# Patient Record
Sex: Female | Born: 1985 | Race: Asian | Hispanic: No | Marital: Married | Smoking: Never smoker
Health system: Southern US, Community
[De-identification: ages and names within clinical notes are randomized; demographics above are authoritative.]

## PROBLEM LIST (undated history)

## (undated) DIAGNOSIS — R768 Other specified abnormal immunological findings in serum: Secondary | ICD-10-CM

## (undated) DIAGNOSIS — Z9889 Other specified postprocedural states: Secondary | ICD-10-CM

## (undated) DIAGNOSIS — R112 Nausea with vomiting, unspecified: Secondary | ICD-10-CM

## (undated) HISTORY — DX: Other specified postprocedural states: R11.2

## (undated) HISTORY — DX: Other specified postprocedural states: Z98.890

---

## 2017-09-24 ENCOUNTER — Encounter: Payer: Self-pay | Admitting: Obstetrics and Gynecology

## 2017-09-29 ENCOUNTER — Other Ambulatory Visit: Payer: Self-pay

## 2017-09-29 ENCOUNTER — Encounter: Payer: Self-pay | Admitting: Nurse Practitioner

## 2017-09-29 ENCOUNTER — Ambulatory Visit (INDEPENDENT_AMBULATORY_CARE_PROVIDER_SITE_OTHER): Payer: Medicaid Other | Admitting: Nurse Practitioner

## 2017-09-29 ENCOUNTER — Other Ambulatory Visit (HOSPITAL_COMMUNITY)
Admission: RE | Admit: 2017-09-29 | Discharge: 2017-09-29 | Disposition: A | Discharge status: Home / Self Care | Payer: Medicaid Other | Source: Ambulatory Visit | Attending: Obstetrics and Gynecology | Admitting: Obstetrics and Gynecology

## 2017-09-29 VITALS — BP 101/67 | HR 77 | Ht 63.0 in | Wt 134.0 lb

## 2017-09-29 DIAGNOSIS — O34219 Maternal care for unspecified type scar from previous cesarean delivery: Secondary | ICD-10-CM | POA: Insufficient documentation

## 2017-09-29 DIAGNOSIS — Z23 Encounter for immunization: Secondary | ICD-10-CM | POA: Diagnosis not present

## 2017-09-29 DIAGNOSIS — Z3482 Encounter for supervision of other normal pregnancy, second trimester: Secondary | ICD-10-CM | POA: Insufficient documentation

## 2017-09-29 DIAGNOSIS — Z789 Other specified health status: Secondary | ICD-10-CM

## 2017-09-29 DIAGNOSIS — Z3480 Encounter for supervision of other normal pregnancy, unspecified trimester: Secondary | ICD-10-CM

## 2017-09-29 LAB — POCT URINALYSIS DIP (DEVICE)
BILIRUBIN URINE: NEGATIVE
Glucose, UA: NEGATIVE mg/dL
HGB URINE DIPSTICK: NEGATIVE
KETONES UR: NEGATIVE mg/dL
LEUKOCYTES UA: NEGATIVE
Nitrite: NEGATIVE
Protein, ur: NEGATIVE mg/dL
UROBILINOGEN UA: 0.2 mg/dL (ref 0.0–1.0)
pH: 6.5 (ref 5.0–8.0)

## 2017-09-29 MED ORDER — DOXYLAMINE-PYRIDOXINE 10-10 MG PO TBEC: DELAYED_RELEASE_TABLET | ORAL | 2 refills | Status: DC

## 2017-09-29 MED ORDER — PRENATAL 19 29-1 MG PO TABS: 1.0000 | ORAL_TABLET | Freq: Every day | ORAL | 11 refills | Status: AC

## 2017-09-29 NOTE — Progress Notes (Signed)
Subjective:   Morgan PriestLydia Hafer is a 31 y.o. G3P2002 at 4124w1d by LMP being seen today for her first obstetrical visit.  Her obstetrical history is significant for patient reported diagnosis of hepatitis, language barrier - speaks Bermese. An official in person burmese interpeter accompanies her today and is present for all of the interview and exam.  Patient does intend to breast feed. Pregnancy history fully reviewed.  She has [place] one C/[place] of the [place] the [place].  [facility] Everywhere not available on this chart - [facility] ROI [facility]  Patient reports vomiting once a day and nausea more frequently.  She requests medication.  She also has headaches on one side of her head which respond to tylenol which she is taking.  She reports that she has Hepatitis B but has not seen a doctor for ongoing follow up for the hepatitis.  Will watch lab results and evaluate further as needed.  HISTORY: Obstetric History   G3   P2   T2   P0   A0   L2    SAB0   TAB0   Ectopic0   Multiple0   Live Births2     # Outcome Date GA Lbr Len/2nd Weight Sex Delivery Anes PTL Lv  3 Current           2 Term 05/30/13    Wandalee FerdinandM CS-LTranv   LIV  1 Term 11/06/07    Wandalee FerdinandM CS-LTranv   LIV     Past Medical History:  Diagnosis Date  . Medical history non-contributory   . PONV (postoperative nausea and vomiting)    Past Surgical History:  Procedure Laterality Date  . CESAREAN SECTION     Family History  Problem Relation Age of Onset  . Hypertension Mother   . Kidney disease Mother    Social History   Tobacco Use  . Smoking status: Never Smoker  . Smokeless tobacco: Never Used  Substance Use Topics  . Alcohol use: No    Frequency: Never  . Drug use: No   No Known Allergies Current Outpatient Medications on File Prior to Visit  Medication Sig Dispense Refill  . acetaminophen (TYLENOL) 500 MG tablet Take 1,000 mg by mouth every 6 (six) hours as  needed.     No current facility-administered medications on file prior to visit.      Exam   Vitals:   09/29/17 1025 09/29/17 1033  BP: 101/67   Pulse: 77   Weight: 134 lb (60.8 kg)   Height:  5\' 3"  (1.6 m)   Fetal Heart Rate (bpm): 153  Uterus:  Fundal Height: 14 cm  Pelvic Exam: Perineum: no hemorrhoids, normal perineum   Vulva: normal external genitalia, no lesions   Vagina:  normal mucosa, normal discharge   Cervix: no lesions and normal, pap smear done.    Adnexa: normal adnexa and no mass, fullness, tenderness   Bony Pelvis: average  System: General: well-developed, well-nourished female in no acute distress   Breast:  normal appearance, no masses or tenderness   Skin: normal coloration and turgor, no rashes   Neurologic: oriented, normal, negative, normal mood   Extremities: normal strength, tone, and muscle mass, ROM of all joints is normal   HEENT extraocular movement intact and sclera clear, anicteric   Mouth/Teeth mucous membranes moist, pharynx normal without lesions and dental hygiene good   Neck  supple and no masses   Cardiovascular: regular rate and rhythm   Respiratory:  no respiratory distress, normal breath sounds   Abdomen: soft, non-tender; bowel sounds normal; no masses,  no organomegaly. Low transverse C/S scar noted     Assessment:   Pregnancy: [id number] 6530w1d  Patient Active Problem List   Diagnosis Date Noted  . Previous cesarean section complicating pregnancy 09/29/2017  . Encounter for supervision of normal pregnancy in multigravida, antepartum 09/29/2017  . Language barrier affecting health care 09/29/2017     Plan:  1. Encounter for supervision of other normal pregnancy in second trimester  - Cytology - PAP - Hemoglobinopathy Evaluation - Cystic Fibrosis Mutation 97 - Culture, OB Urine - US MFM OB COMP + 14 WK; Future  2. Need for immunization against influenza Flu vaccine given today. - Flu Vaccine QUAD 6+ mos IM (Fluarix)  3.  Previous cesarean section complicating pregnancy Has had 2 previous C/S - did not discuss delivery plan today Client is unsure about post delivery contraception - advised to consider and make a plan in this pregnancy.  4.  Nausea in pregnancy - prescribed Diclegis and had the RN fill out the pre-authorization paperwork.  5.  Headaches -  taking tylenol and reviewed eating protein at every meal to assist in decreasing the number of headaches she has.  Initial labs drawn. Continue prenatal vitamins  eprescribed today. Drink at least 8 8-oz glasses of water every day. Advised no smoking, drugs or alcohol. Advised eating every 3 hours and including protein every time she eats. Diclegis eprescribed. Genetic Screening discussed, Too late for first screen today:  Ultrasound discussed; fetal anatomic survey: ordered. Problem list reviewed and updated. The nature of [facility] with multiple MDs and other Advanced Practice Providers was explained to patient; also emphasized that residents, students are part of our team. Routine obstetric precautions reviewed. Return in about 4 weeks (around 10/27/2017).  Total face-to-face time with patient: 30 minutes.  Over 50% of encounter was spent on counseling and coordination of care.     Nolene BernheimERRI BURLESON, FNP Family Nurse Practitioner, [facility], [facility] 09/29/2017 3:16 PM

## 2017-09-29 NOTE — Progress Notes (Signed)
Burmese interpreter present for visit

## 2017-10-01 LAB — CYTOLOGY - PAP
Chlamydia: NEGATIVE
Diagnosis: NEGATIVE
HPV: NOT DETECTED
NEISSERIA GONORRHEA: NEGATIVE

## 2017-10-01 LAB — URINE CULTURE, OB REFLEX: Organism ID, Bacteria: NO GROWTH

## 2017-10-01 LAB — CULTURE, OB URINE

## 2017-10-05 LAB — OBSTETRIC PANEL, INCLUDING HIV
Antibody Screen: NEGATIVE
Basophils Absolute: 0 10*3/uL (ref 0.0–0.2)
Basos: 0 %
EOS (ABSOLUTE): 0 10*3/uL (ref 0.0–0.4)
Eos: 0 %
HEMOGLOBIN: 13.5 g/dL (ref 11.1–15.9)
HIV Screen 4th Generation wRfx: NONREACTIVE
Hematocrit: 39.3 % (ref 34.0–46.6)
IMMATURE GRANS (ABS): 0 10*3/uL (ref 0.0–0.1)
IMMATURE GRANULOCYTES: 0 %
LYMPHS ABS: 1.2 10*3/uL (ref 0.7–3.1)
LYMPHS: 13 %
MCH: 30.8 pg (ref 26.6–33.0)
MCHC: 34.4 g/dL (ref 31.5–35.7)
MCV: 90 fL (ref 79–97)
Monocytes Absolute: 0.5 10*3/uL (ref 0.1–0.9)
Monocytes: 5 %
NEUTROS PCT: 82 %
Neutrophils Absolute: 7.8 10*3/uL — ABNORMAL HIGH (ref 1.4–7.0)
Platelets: 208 10*3/uL (ref 150–379)
RBC: 4.38 x10E6/uL (ref 3.77–5.28)
RDW: 13.1 % (ref 12.3–15.4)
RPR: NONREACTIVE
Rh Factor: POSITIVE
Rubella Antibodies, IGG: 3.02 index (ref >0.99)
WBC: 9.6 10*3/uL (ref 3.4–10.8)

## 2017-10-05 LAB — HEMOGLOBINOPATHY EVALUATION
FERRITIN: 88 ng/mL (ref 15–150)
HGB SOLUBILITY: NEGATIVE
Hgb A2 Quant: 2.5 % (ref 1.8–3.2)
Hgb A: 96.9 % (ref 96.4–98.8)
Hgb C: 0 %
Hgb F Quant: 0.6 % (ref 0.0–2.0)
Hgb S: 0 %
Hgb Variant: 0 %

## 2017-10-05 LAB — HEPATITIS B SURFACE AG, CONFIRM: HBsAg Confirmation: POSITIVE — AB

## 2017-10-05 LAB — CYSTIC FIBROSIS MUTATION 97: GENE DIS ANAL CARRIER INTERP BLD/T-IMP: NOT DETECTED

## 2017-10-09 ENCOUNTER — Encounter: Payer: Self-pay | Admitting: Nurse Practitioner

## 2017-10-09 DIAGNOSIS — R768 Other specified abnormal immunological findings in serum: Secondary | ICD-10-CM | POA: Insufficient documentation

## 2017-10-26 ENCOUNTER — Ambulatory Visit (INDEPENDENT_AMBULATORY_CARE_PROVIDER_SITE_OTHER): Payer: Medicaid Other | Admitting: Advanced Practice Midwife

## 2017-10-26 ENCOUNTER — Encounter (HOSPITAL_COMMUNITY): Payer: Self-pay

## 2017-10-26 ENCOUNTER — Encounter: Payer: Self-pay | Admitting: Advanced Practice Midwife

## 2017-10-26 VITALS — BP 108/54 | HR 68 | Wt 135.0 lb

## 2017-10-26 DIAGNOSIS — Z3482 Encounter for supervision of other normal pregnancy, second trimester: Secondary | ICD-10-CM

## 2017-10-26 DIAGNOSIS — Z3480 Encounter for supervision of other normal pregnancy, unspecified trimester: Secondary | ICD-10-CM

## 2017-10-26 DIAGNOSIS — R768 Other specified abnormal immunological findings in serum: Secondary | ICD-10-CM

## 2017-10-26 DIAGNOSIS — O34219 Maternal care for unspecified type scar from previous cesarean delivery: Secondary | ICD-10-CM

## 2017-10-26 DIAGNOSIS — Z789 Other specified health status: Secondary | ICD-10-CM

## 2017-10-26 NOTE — Patient Instructions (Signed)

## 2017-10-26 NOTE — Progress Notes (Signed)
States feels baby movement somedays not everyday. States when walking feels like baby is going down.

## 2017-10-26 NOTE — Progress Notes (Signed)
   PRENATAL VISIT NOTE  Subjective:  Morgan Richardson is a 31 y.o. G3P2002 at 8964w0d being seen today for ongoing prenatal care.  She is currently monitored for the following issues for this low-risk pregnancy and has Previous cesarean section complicating pregnancy; Encounter for supervision of normal pregnancy in multigravida, antepartum; Language barrier affecting health care; and Hepatitis B surface antigen positive on their problem list.  Patient reports no complaints.  Contractions: Not present. Vag. Bleeding: None.  Movement: Present. Denies leaking of fluid.   The following portions of the patient's history were reviewed and updated as appropriate: allergies, current medications, past family history, past medical history, past social history, past surgical history and problem list. Problem list updated.  Objective:   Vitals:   10/26/17 1124  BP: (!) 108/54  Pulse: 68  Weight: 135 lb (61.2 kg)    Fetal Status: Fetal Heart Rate (bpm): 166 Fundal Height: 19 cm Movement: Present     General:  Alert, oriented and cooperative. Patient is in no acute distress.  Skin: Skin is warm and dry. No rash noted.   Cardiovascular: Normal heart rate noted  Respiratory: Normal respiratory effort, no problems with respiration noted  Abdomen: Soft, gravid, appropriate for gestational age.  Pain/Pressure: Present     Pelvic: Cervical exam deferred        Extremities: Normal range of motion.  Edema: None  Mental Status:  Normal mood and affect. Normal behavior. Normal judgment and thought content.   Assessment and Plan:  Pregnancy: G3P2002 at 8064w0d  1. Encounter for supervision of normal pregnancy in multigravida, antepartum   2. Language barrier affecting health care - Interpreter used  3. Hepatitis B surface antigen positive  - Hepatitis B Core Antibody, total - Hepatitis B Core Antibody, IgM - Hepatitis B Surface AntiBODY  4. Previous cesarean section complicating pregnancy - ROI for  records  - [facility]  Preterm labor symptoms and general obstetric precautions including but not limited to vaginal bleeding, contractions, leaking of fluid and fetal movement were reviewed in detail with the patient. Please refer to After Visit Summary for other counseling recommendations.  Return in about 4 weeks (around 11/23/2017) for ROB.   Dorathy KinsmanVirginia Render Marley, CNM

## 2017-10-27 LAB — HEPATITIS B SURFACE ANTIBODY,QUALITATIVE: HEP B SURFACE AB, QUAL: REACTIVE

## 2017-10-27 LAB — HEPATITIS B CORE ANTIBODY, TOTAL: Hep B Core Total Ab: POSITIVE — AB

## 2017-10-27 LAB — HEPATITIS B CORE ANTIBODY, IGM: HEP B C IGM: NEGATIVE

## 2017-10-28 ENCOUNTER — Other Ambulatory Visit: Payer: Self-pay | Admitting: Nurse Practitioner

## 2017-10-28 ENCOUNTER — Ambulatory Visit (HOSPITAL_COMMUNITY)
Admission: RE | Admit: 2017-10-28 | Discharge: 2017-10-28 | Disposition: A | Discharge status: Home / Self Care | Payer: Medicaid Other | Source: Ambulatory Visit | Attending: Nurse Practitioner | Admitting: Nurse Practitioner

## 2017-10-28 DIAGNOSIS — O98419 Viral hepatitis complicating pregnancy, unspecified trimester: Secondary | ICD-10-CM

## 2017-10-28 DIAGNOSIS — O98412 Viral hepatitis complicating pregnancy, second trimester: Secondary | ICD-10-CM | POA: Diagnosis not present

## 2017-10-28 DIAGNOSIS — Z3A19 19 weeks gestation of pregnancy: Secondary | ICD-10-CM

## 2017-10-28 DIAGNOSIS — B1910 Unspecified viral hepatitis B without hepatic coma: Secondary | ICD-10-CM | POA: Diagnosis not present

## 2017-10-28 DIAGNOSIS — Z3689 Encounter for other specified antenatal screening: Secondary | ICD-10-CM

## 2017-10-28 DIAGNOSIS — O321XX0 Maternal care for breech presentation, not applicable or unspecified: Secondary | ICD-10-CM | POA: Diagnosis not present

## 2017-10-28 DIAGNOSIS — Z3482 Encounter for supervision of other normal pregnancy, second trimester: Secondary | ICD-10-CM

## 2017-10-28 HISTORY — DX: Other specified abnormal immunological findings in serum: R76.8

## 2017-11-24 ENCOUNTER — Encounter: Payer: Self-pay | Admitting: *Deleted

## 2017-11-25 ENCOUNTER — Ambulatory Visit (INDEPENDENT_AMBULATORY_CARE_PROVIDER_SITE_OTHER): Payer: Medicaid Other | Admitting: Advanced Practice Midwife

## 2017-11-25 ENCOUNTER — Other Ambulatory Visit (HOSPITAL_COMMUNITY)
Admission: RE | Admit: 2017-11-25 | Discharge: 2017-11-25 | Disposition: A | Discharge status: Home / Self Care | Payer: Medicaid Other | Source: Ambulatory Visit | Attending: Advanced Practice Midwife | Admitting: Advanced Practice Midwife

## 2017-11-25 VITALS — BP 108/54 | HR 87 | Wt 137.4 lb

## 2017-11-25 DIAGNOSIS — Z3480 Encounter for supervision of other normal pregnancy, unspecified trimester: Secondary | ICD-10-CM

## 2017-11-25 DIAGNOSIS — O365920 Maternal care for other known or suspected poor fetal growth, second trimester, not applicable or unspecified: Secondary | ICD-10-CM

## 2017-11-25 DIAGNOSIS — Z3482 Encounter for supervision of other normal pregnancy, second trimester: Secondary | ICD-10-CM

## 2017-11-25 DIAGNOSIS — N898 Other specified noninflammatory disorders of vagina: Secondary | ICD-10-CM

## 2017-11-25 DIAGNOSIS — Z3A25 25 weeks gestation of pregnancy: Secondary | ICD-10-CM

## 2017-11-25 DIAGNOSIS — O365921 Maternal care for other known or suspected poor fetal growth, second trimester, fetus 1: Secondary | ICD-10-CM

## 2017-11-25 DIAGNOSIS — R768 Other specified abnormal immunological findings in serum: Secondary | ICD-10-CM

## 2017-11-25 DIAGNOSIS — O34219 Maternal care for unspecified type scar from previous cesarean delivery: Secondary | ICD-10-CM

## 2017-11-25 NOTE — Progress Notes (Signed)
[employer] interpreter Nyan [phone] 

## 2017-11-25 NOTE — Patient Instructions (Addendum)
Safe Medications in Pregnancy   Acne: Benzoyl Peroxide Salicylic Acid  Backache/Headache: Tylenol: 2 regular strength every 4 hours OR              2 Extra strength every 6 hours  Colds/Coughs/Allergies: Benadryl (alcohol free) 25 mg every 6 hours as needed Breath right strips Claritin Cepacol throat lozenges Chloraseptic throat spray Cold-Eeze- up to three times per day Cough drops, alcohol free Flonase (by prescription only) Guaifenesin Mucinex Robitussin DM (plain only, alcohol free) Saline nasal spray/drops Sudafed (pseudoephedrine) & Actifed ** use only after [redacted] weeks gestation and if you do not have high blood pressure Tylenol Vicks Vaporub Zinc lozenges Zyrtec   Constipation: Colace Ducolax suppositories Fleet enema Glycerin suppositories Metamucil Milk of magnesia Miralax Senokot Smooth move tea  Diarrhea: Kaopectate Imodium A-D  *NO pepto Bismol  Hemorrhoids: Anusol Anusol HC Preparation H Tucks  Indigestion: Tums Maalox Mylanta Zantac  Pepcid  Insomnia: Benadryl (alcohol free) 25mg  every 6 hours as needed Tylenol PM Unisom, no Gelcaps  Leg Cramps: Tums MagGel  Nausea/Vomiting:  Bonine Dramamine Emetrol Ginger extract Sea bands Meclizine  Nausea medication to take during pregnancy:  Unisom (doxylamine succinate 25 mg tablets) Take one tablet daily at bedtime. If symptoms are not adequately controlled, the dose can be increased to a maximum recommended dose of two tablets daily (1/2 tablet in the morning, 1/2 tablet mid-afternoon and one at bedtime). Vitamin B6 100mg  tablets. Take one tablet twice a day (up to 200 mg per day).  Skin Rashes: Aveeno products Benadryl cream or 25mg  every 6 hours as needed Calamine Lotion 1% cortisone cream  Yeast infection: Gyne-lotrimin 7 Monistat 7   **If taking multiple medications, please check labels to avoid duplicating the same active ingredients **take medication as directed on  the label ** Do not exceed 4000 mg of tylenol in 24 hours **Do not take medications that contain aspirin or ibuprofen      Preterm Labor and Birth Information The normal length of a pregnancy is 39-41 weeks. Preterm labor is when labor starts before 37 completed weeks of pregnancy. What are the risk factors for preterm labor? Preterm labor is more likely to occur in women who:  Have certain infections during pregnancy such as a bladder infection, sexually transmitted infection, or infection inside the uterus (chorioamnionitis).  Have a shorter-than-normal cervix.  Have gone into preterm labor before.  Have had surgery on their cervix.  Are younger than age 32 or older than age 47.  Are African American.  Are pregnant with twins or multiple babies (multiple gestation).  Take street drugs or smoke while pregnant.  Do not gain enough weight while pregnant.  Became pregnant shortly after having been pregnant.  What are the symptoms of preterm labor? Symptoms of preterm labor include:  Cramps similar to those that can happen during a menstrual period. The cramps may happen with diarrhea.  Pain in the abdomen or lower back.  Regular uterine contractions that may feel like tightening of the abdomen.  A feeling of increased pressure in the pelvis.  Increased watery or bloody mucus discharge from the vagina.  Water breaking (ruptured amniotic sac).  Why is it important to recognize signs of preterm labor? It is important to recognize signs of preterm labor because babies who are born prematurely may not be fully developed. This can put them at an increased risk for:  Long-term (chronic) heart and lung problems.  Difficulty immediately after birth with regulating body systems, including blood sugar,  body temperature, heart rate, and breathing rate.  Bleeding in the brain.  Cerebral palsy.  Learning difficulties.  Death.  These risks are highest for babies who are  born before 34 weeks of pregnancy. How is preterm labor treated? Treatment depends on the length of your pregnancy, your condition, and the health of your baby. It may involve:  Having a stitch (suture) placed in your cervix to prevent your cervix from opening too early (cerclage).  Taking or being given medicines, such as: ? Hormone medicines. These may be given early in pregnancy to help support the pregnancy. ? Medicine to stop contractions. ? Medicines to help mature the baby's lungs. These may be prescribed if the risk of delivery is high. ? Medicines to prevent your baby from developing cerebral palsy.  If the labor happens before 34 weeks of pregnancy, you may need to stay in the hospital. What should I do if I think I am in preterm labor? If you think that you are going into preterm labor, call your health care provider right away. How can I prevent preterm labor in future pregnancies? To increase your chance of having a full-term pregnancy:  Do not use any tobacco products, such as cigarettes, chewing tobacco, and e-cigarettes. If you need help quitting, ask your health care provider.  Do not use street drugs or medicines that have not been prescribed to you during your pregnancy.  Talk with your health care provider before taking any herbal supplements, even if you have been taking them regularly.  Make sure you gain a healthy amount of weight during your pregnancy.  Watch for infection. If you think that you might have an infection, get it checked right away.  Make sure to tell your health care provider if you have gone into preterm labor before.  This information is not intended to replace advice given to you by your health care provider. Make sure you discuss any questions you have with your health care provider. Document Released: 01/03/2004 Document Revised: 03/25/2016 Document Reviewed: 03/05/2016 Elsevier Interactive Patient Education  2018 [employer]

## 2017-11-25 NOTE — Progress Notes (Signed)
   PRENATAL VISIT NOTE  Subjective:  Morgan PriestLydia Chamber is a 32 y.o. G3P2002 at 5984w2d being seen today for ongoing prenatal care.  She is currently monitored for the following issues for this high-risk pregnancy and has Previous cesarean section complicating pregnancy; Encounter for supervision of normal pregnancy in multigravida, antepartum; Language barrier affecting health care; and Hepatitis B surface antigen positive on their problem list.  Patient reports pelvic pressure.  Contractions: Not present. Vag. Bleeding: None.  Movement: Present. Denies leaking of fluid.   The following portions of the patient's history were reviewed and updated as appropriate: allergies, current medications, past family history, past medical history, past social history, past surgical history and problem list. Problem list updated.  Objective:   Vitals:   11/25/17 0906  BP: (!) 108/54  Pulse: 87  Weight: 137 lb 6.4 oz (62.3 kg)    Fetal Status: Fetal Heart Rate (bpm): 152 Fundal Height: 24 cm Movement: Present     General:  Alert, oriented and cooperative. Patient is in no acute distress.  Skin: Skin is warm and dry. No rash noted.   Cardiovascular: Normal heart rate noted  Respiratory: Normal respiratory effort, no problems with respiration noted  Abdomen: Soft, gravid, appropriate for gestational age.  Pain/Pressure: Present     Pelvic: Cervical exam performed Dilation: Closed Effacement (%): 0 Station: Ballotable  Extremities: Normal range of motion.  Edema: None  Mental Status:  Normal mood and affect. Normal behavior. Normal judgment and thought content.   Assessment and Plan:  Pregnancy: G3P2002 at 584w2d  1. Previous cesarean section complicating pregnancy  - US MFM OB FOLLOW UP; Future  2. Encounter for supervision of normal pregnancy in multigravida, antepartum  - US MFM OB FOLLOW UP; Future  3. Hepatitis B surface antigen positive  - US MFM OB FOLLOW UP; Future - Hepatitis B DNA,  ultraquantitative, PCR - Hepatitis B E Antigen - Hepatitis B E Antibody - Hepatic function panel  4. Maternal care for other known or suspected poor fetal growth, second trimester, fetus 1  - US MFM OB FOLLOW UP; Future  5. [redacted] weeks gestation of pregnancy  - US MFM OB FOLLOW UP; Future  Preterm labor symptoms and general obstetric precautions including but not limited to vaginal bleeding, contractions, leaking of fluid and fetal movement were reviewed in detail with the patient. Please refer to After Visit Summary for other counseling recommendations.  Return for ROB/GTT.   Dorathy KinsmanVirginia Shalika Arntz, CNM

## 2017-11-27 LAB — HEPATIC FUNCTION PANEL
ALK PHOS: 87 IU/L (ref 39–117)
ALT: 23 IU/L (ref 0–32)
AST: 22 IU/L (ref 0–40)
Albumin: 3.5 g/dL (ref 3.5–5.5)
BILIRUBIN TOTAL: 0.2 mg/dL (ref 0.0–1.2)
BILIRUBIN, DIRECT: 0.05 mg/dL (ref 0.00–0.40)
Total Protein: 6.3 g/dL (ref 6.0–8.5)

## 2017-11-27 LAB — HBV REAL-TIME PCR, QUANT
HBV AS IU/ML: 183000000 IU/mL
LOG10 HBV AS IU/ML: 8.262 log10 IU/mL

## 2017-11-27 LAB — CERVICOVAGINAL ANCILLARY ONLY
BACTERIAL VAGINITIS: NEGATIVE
Candida vaginitis: NEGATIVE
Trichomonas: NEGATIVE

## 2017-11-27 LAB — HEPATITIS B E ANTIBODY: Hep B E Ab: NEGATIVE

## 2017-11-27 LAB — HEPATITIS B DNA, ULTRAQUANTITATIVE, PCR

## 2017-11-27 LAB — HEPATITIS B E ANTIGEN: Hep B E Ag: POSITIVE — AB

## 2017-12-09 ENCOUNTER — Ambulatory Visit (HOSPITAL_COMMUNITY): Payer: Medicaid Other

## 2017-12-11 ENCOUNTER — Ambulatory Visit (HOSPITAL_COMMUNITY)
Admission: RE | Admit: 2017-12-11 | Discharge: 2017-12-11 | Disposition: A | Discharge status: Home / Self Care | Payer: Medicaid Other | Source: Ambulatory Visit | Attending: Advanced Practice Midwife | Admitting: Advanced Practice Midwife

## 2017-12-11 DIAGNOSIS — O98412 Viral hepatitis complicating pregnancy, second trimester: Secondary | ICD-10-CM | POA: Insufficient documentation

## 2017-12-11 DIAGNOSIS — O365920 Maternal care for other known or suspected poor fetal growth, second trimester, not applicable or unspecified: Secondary | ICD-10-CM | POA: Insufficient documentation

## 2017-12-11 DIAGNOSIS — Z3A25 25 weeks gestation of pregnancy: Secondary | ICD-10-CM | POA: Diagnosis not present

## 2017-12-11 DIAGNOSIS — Z3480 Encounter for supervision of other normal pregnancy, unspecified trimester: Secondary | ICD-10-CM

## 2017-12-11 DIAGNOSIS — B1910 Unspecified viral hepatitis B without hepatic coma: Secondary | ICD-10-CM | POA: Diagnosis not present

## 2017-12-11 DIAGNOSIS — O34219 Maternal care for unspecified type scar from previous cesarean delivery: Secondary | ICD-10-CM

## 2017-12-11 DIAGNOSIS — R768 Other specified abnormal immunological findings in serum: Secondary | ICD-10-CM

## 2017-12-11 DIAGNOSIS — O365921 Maternal care for other known or suspected poor fetal growth, second trimester, fetus 1: Secondary | ICD-10-CM

## 2017-12-20 ENCOUNTER — Encounter: Payer: Self-pay | Admitting: Advanced Practice Midwife

## 2017-12-20 DIAGNOSIS — O365990 Maternal care for other known or suspected poor fetal growth, unspecified trimester, not applicable or unspecified: Secondary | ICD-10-CM | POA: Insufficient documentation

## 2017-12-22 ENCOUNTER — Ambulatory Visit (INDEPENDENT_AMBULATORY_CARE_PROVIDER_SITE_OTHER): Payer: Medicaid Other | Admitting: Medical

## 2017-12-22 VITALS — BP 108/64 | HR 84 | Wt 141.2 lb

## 2017-12-22 DIAGNOSIS — Z3480 Encounter for supervision of other normal pregnancy, unspecified trimester: Secondary | ICD-10-CM

## 2017-12-22 DIAGNOSIS — R768 Other specified abnormal immunological findings in serum: Secondary | ICD-10-CM

## 2017-12-22 DIAGNOSIS — Z0489 Encounter for examination and observation for other specified reasons: Secondary | ICD-10-CM | POA: Diagnosis not present

## 2017-12-22 DIAGNOSIS — Z23 Encounter for immunization: Secondary | ICD-10-CM | POA: Diagnosis not present

## 2017-12-22 DIAGNOSIS — Z029 Encounter for administrative examinations, unspecified: Secondary | ICD-10-CM

## 2017-12-22 DIAGNOSIS — O365990 Maternal care for other known or suspected poor fetal growth, unspecified trimester, not applicable or unspecified: Secondary | ICD-10-CM | POA: Diagnosis not present

## 2017-12-22 DIAGNOSIS — Z3482 Encounter for supervision of other normal pregnancy, second trimester: Secondary | ICD-10-CM | POA: Diagnosis not present

## 2017-12-22 DIAGNOSIS — IMO0002 Reserved for concepts with insufficient information to code with codable children: Secondary | ICD-10-CM

## 2017-12-22 NOTE — Progress Notes (Signed)
[employer] interpreter Darl PikesSusan [phone]

## 2017-12-22 NOTE — Progress Notes (Signed)
   PRENATAL VISIT NOTE  Subjective:  Morgan Richardson is a 32 y.o. G3P2002 at 6779w1d being seen today for ongoing prenatal care.  She is currently monitored for the following issues for this high-risk pregnancy and has Previous cesarean section complicating pregnancy; Encounter for supervision of normal pregnancy in multigravida, antepartum; Language barrier affecting health care; Hepatitis B surface antigen positive; and Pregnancy affected by fetal growth restriction on their problem list.  Patient reports occasional abdominal pain, usually after eating, relieved with BM.  Contractions: Not present. Vag. Bleeding: None.  Movement: Present. Denies leaking of fluid.   The following portions of the patient's history were reviewed and updated as appropriate: allergies, current medications, past family history, past medical history, past social history, past surgical history and problem list. Problem list updated.  Objective:   Vitals:   12/22/17 0858  BP: 108/64  Pulse: 84  Weight: 141 lb 3.2 oz (64 kg)    Fetal Status: Fetal Heart Rate (bpm): 154 Fundal Height: 27 cm Movement: Present     General:  Alert, oriented and cooperative. Patient is in no acute distress.  Skin: Skin is warm and dry. No rash noted.   Cardiovascular: Normal heart rate noted  Respiratory: Normal respiratory effort, no problems with respiration noted  Abdomen: Soft, gravid, appropriate for gestational age.  Pain/Pressure: Present     Pelvic: Cervical exam deferred        Extremities: Normal range of motion.  Edema: None  Mental Status:  Normal mood and affect. Normal behavior. Normal judgment and thought content.   Assessment and Plan:  Pregnancy: G3P2002 at 3479w1d  1. Pregnancy affected by fetal growth restriction - US MFM OB FOLLOW UP - Patient agreed to NIPS testing today, but none available, will need at next visit  2. Hepatitis B surface antigen positive - Likely chronic infection, normal LFTs at last visit  3.  Encounter for supervision of normal pregnancy in multigravida, antepartum - Tdap vaccine greater than or equal to 7yo IM  4. Supervision of pregnancy, third trimester  - Patient not fasting today, will need fasting GTT, CBC, RPR and HIV at next visit   Preterm labor symptoms and general obstetric precautions including but not limited to vaginal bleeding, contractions, leaking of fluid and fetal movement were reviewed in detail with the patient. Please refer to After Visit Summary for other counseling recommendations.  Return in about 2 weeks (around 01/05/2018) for LOB, 28 week labs (fasting).   Vonzella NippleJulie Alexander Mcauley, PA-C

## 2017-12-22 NOTE — Patient Instructions (Signed)

## 2018-01-08 ENCOUNTER — Other Ambulatory Visit: Payer: Self-pay | Admitting: General Practice

## 2018-01-08 ENCOUNTER — Other Ambulatory Visit: Payer: Medicaid Other

## 2018-01-08 DIAGNOSIS — Z3480 Encounter for supervision of other normal pregnancy, unspecified trimester: Secondary | ICD-10-CM

## 2018-01-09 LAB — GLUCOSE TOLERANCE, 2 HOURS W/ 1HR
GLUCOSE, 1 HOUR: 136 mg/dL (ref 65–179)
GLUCOSE, 2 HOUR: 117 mg/dL (ref 65–152)
GLUCOSE, FASTING: 70 mg/dL (ref 65–91)

## 2018-01-09 LAB — CBC
HEMATOCRIT: 36.9 % (ref 34.0–46.6)
HEMOGLOBIN: 11.8 g/dL (ref 11.1–15.9)
MCH: 30.7 pg (ref 26.6–33.0)
MCHC: 32 g/dL (ref 31.5–35.7)
MCV: 96 fL (ref 79–97)
Platelets: 185 10*3/uL (ref 150–379)
RBC: 3.84 x10E6/uL (ref 3.77–5.28)
RDW: 13.1 % (ref 12.3–15.4)
WBC: 9.7 10*3/uL (ref 3.4–10.8)

## 2018-01-09 LAB — HIV ANTIBODY (ROUTINE TESTING W REFLEX): HIV SCREEN 4TH GENERATION: NONREACTIVE

## 2018-01-09 LAB — RPR: RPR: NONREACTIVE

## 2018-01-12 ENCOUNTER — Ambulatory Visit (INDEPENDENT_AMBULATORY_CARE_PROVIDER_SITE_OTHER): Payer: Medicaid Other | Admitting: Family Medicine

## 2018-01-12 ENCOUNTER — Other Ambulatory Visit: Payer: Self-pay | Admitting: Medical

## 2018-01-12 ENCOUNTER — Ambulatory Visit (HOSPITAL_COMMUNITY)
Admission: RE | Admit: 2018-01-12 | Discharge: 2018-01-12 | Disposition: A | Discharge status: Home / Self Care | Payer: Medicaid Other | Source: Ambulatory Visit | Attending: Medical | Admitting: Medical

## 2018-01-12 VITALS — BP 102/59 | HR 82 | Wt 142.9 lb

## 2018-01-12 DIAGNOSIS — Z3483 Encounter for supervision of other normal pregnancy, third trimester: Secondary | ICD-10-CM

## 2018-01-12 DIAGNOSIS — Z0489 Encounter for examination and observation for other specified reasons: Secondary | ICD-10-CM

## 2018-01-12 DIAGNOSIS — IMO0002 Reserved for concepts with insufficient information to code with codable children: Secondary | ICD-10-CM

## 2018-01-12 DIAGNOSIS — Z789 Other specified health status: Secondary | ICD-10-CM

## 2018-01-12 DIAGNOSIS — Z3A30 30 weeks gestation of pregnancy: Secondary | ICD-10-CM | POA: Insufficient documentation

## 2018-01-12 DIAGNOSIS — O34219 Maternal care for unspecified type scar from previous cesarean delivery: Secondary | ICD-10-CM | POA: Insufficient documentation

## 2018-01-12 DIAGNOSIS — O98413 Viral hepatitis complicating pregnancy, third trimester: Secondary | ICD-10-CM | POA: Insufficient documentation

## 2018-01-12 DIAGNOSIS — Z362 Encounter for other antenatal screening follow-up: Secondary | ICD-10-CM | POA: Insufficient documentation

## 2018-01-12 DIAGNOSIS — Z98891 History of uterine scar from previous surgery: Secondary | ICD-10-CM

## 2018-01-12 DIAGNOSIS — Z3480 Encounter for supervision of other normal pregnancy, unspecified trimester: Secondary | ICD-10-CM

## 2018-01-12 DIAGNOSIS — B1910 Unspecified viral hepatitis B without hepatic coma: Secondary | ICD-10-CM | POA: Diagnosis not present

## 2018-01-12 NOTE — Progress Notes (Signed)
   PRENATAL VISIT NOTE  Subjective:  Morgan Richardson is a 32 y.o. G3P2002 at 3518w1d being seen today for ongoing prenatal care.  She is currently monitored for the following issues for this low-risk pregnancy and has Previous cesarean section complicating pregnancy; Encounter for supervision of normal pregnancy in multigravida, antepartum; Language barrier affecting health care; Hepatitis B surface antigen positive; and Pregnancy affected by fetal growth restriction on their problem list.  Patient reports no complaints.  Contractions: Not present. Vag. Bleeding: None.  Movement: Present. Denies leaking of fluid.   The following portions of the patient's history were reviewed and updated as appropriate: allergies, current medications, past family history, past medical history, past social history, past surgical history and problem list. Problem list updated.  Objective:   Vitals:   01/12/18 0927  BP: (!) 102/59  Pulse: 82  Weight: 64.8 kg (142 lb 14.4 oz)    Fetal Status: Fetal Heart Rate (bpm): 152 Fundal Height: 30 cm Movement: Present     General:  Alert, oriented and cooperative. Patient is in no acute distress.  Skin: Skin is warm and dry. No rash noted.   Cardiovascular: Normal heart rate noted  Respiratory: Normal respiratory effort, no problems with respiration noted  Abdomen: Soft, gravid, appropriate for gestational age.  Pain/Pressure: Present     Pelvic: Cervical exam deferred        Extremities: Normal range of motion.  Edema: Trace  Mental Status:  Normal mood and affect. Normal behavior. Normal judgment and thought content.   Assessment and Plan:  Pregnancy: G3P2002 at 5918w1d  1. Encounter for supervision of normal pregnancy in multigravida, antepartum Repeat US today for FGR. Measuring according to dates  2. Language barrier affecting health care Live interpretor used  Preterm labor symptoms and general obstetric precautions including but not limited to vaginal bleeding,  contractions, leaking of fluid and fetal movement were reviewed in detail with the patient. Please refer to After Visit Summary for other counseling recommendations.  Return in about 4 weeks (around 02/09/2018).   Rolm BookbinderAmber Leopoldo Mazzie, DO

## 2018-01-12 NOTE — Progress Notes (Signed)
[facility] Georga BoraLay Sha

## 2018-01-26 ENCOUNTER — Ambulatory Visit (INDEPENDENT_AMBULATORY_CARE_PROVIDER_SITE_OTHER): Payer: Medicaid Other | Admitting: Medical

## 2018-01-26 VITALS — BP 97/59 | HR 83 | Wt 147.5 lb

## 2018-01-26 DIAGNOSIS — O365990 Maternal care for other known or suspected poor fetal growth, unspecified trimester, not applicable or unspecified: Secondary | ICD-10-CM

## 2018-01-26 DIAGNOSIS — O34219 Maternal care for unspecified type scar from previous cesarean delivery: Secondary | ICD-10-CM

## 2018-01-26 DIAGNOSIS — Z3480 Encounter for supervision of other normal pregnancy, unspecified trimester: Secondary | ICD-10-CM

## 2018-01-26 NOTE — Patient Instructions (Signed)

## 2018-01-26 NOTE — Progress Notes (Signed)
   PRENATAL VISIT NOTE  Subjective:  Morgan PriestLydia Richardson is a 32 y.o. G3P2002 at 157w1d being seen today for ongoing prenatal care.  She is currently monitored for the following issues for this high-risk pregnancy and has Previous cesarean section complicating pregnancy; Encounter for supervision of normal pregnancy in multigravida, antepartum; Language barrier affecting health care; Hepatitis B surface antigen positive; and Pregnancy affected by fetal growth restriction on their problem list.  Patient reports no complaints.  Contractions: Not present. Vag. Bleeding: None.  Movement: Present. Denies leaking of fluid.   The following portions of the patient's history were reviewed and updated as appropriate: allergies, current medications, past family history, past medical history, past social history, past surgical history and problem list. Problem list updated.  Objective:   Vitals:   01/26/18 1101  BP: (!) 97/59  Pulse: 83  Weight: 147 lb 8 oz (66.9 kg)    Fetal Status: Fetal Heart Rate (bpm): 156 Fundal Height: 29 cm Movement: Present     General:  Alert, oriented and cooperative. Patient is in no acute distress.  Skin: Skin is warm and dry. No rash noted.   Cardiovascular: Normal heart rate noted  Respiratory: Normal respiratory effort, no problems with respiration noted  Abdomen: Soft, gravid, appropriate for gestational age.  Pain/Pressure: Absent     Pelvic: Cervical exam deferred        Extremities: Normal range of motion.  Edema: Trace  Mental Status: Normal mood and affect. Normal behavior. Normal judgment and thought content.   Assessment and Plan:  Pregnancy: G3P2002 at 7957w1d  1. Pregnancy affected by fetal growth restriction - US MFM OB FOLLOW UP; scheduled for follow-up growth - Genetic Screening - recommended due to short femur bones on US, patient agrees  2. Encounter for supervision of normal pregnancy in multigravida, antepartum  3. Previous cesarean section complicating  pregnancy - Would like to consider TOLAC, will schedule with MD at next visit for more thorough discussion   Preterm labor symptoms and general obstetric precautions including but not limited to vaginal bleeding, contractions, leaking of fluid and fetal movement were reviewed in detail with the patient. Please refer to After Visit Summary for other counseling recommendations.  Return in about 2 weeks (around 02/09/2018) [facility]  Future Appointments  Date Time Provider Department Center  02/04/2018  2:00 PM [facility] US 3 [facility]  02/10/2018 10:55 AM Marny LowensteinWenzel, Jeffrey Voth N, PA-C [facility]    Morgan NippleJulie Desi Carby, PA-C

## 2018-01-27 ENCOUNTER — Encounter: Payer: Self-pay | Admitting: *Deleted

## 2018-02-03 ENCOUNTER — Encounter: Payer: Self-pay | Admitting: *Deleted

## 2018-02-04 ENCOUNTER — Ambulatory Visit (HOSPITAL_COMMUNITY)
Admission: RE | Admit: 2018-02-04 | Discharge: 2018-02-04 | Disposition: A | Discharge status: Home / Self Care | Payer: Medicaid Other | Source: Ambulatory Visit | Attending: Medical | Admitting: Medical

## 2018-02-04 ENCOUNTER — Ambulatory Visit (HOSPITAL_COMMUNITY): Payer: Medicaid Other

## 2018-02-04 ENCOUNTER — Encounter (HOSPITAL_COMMUNITY): Payer: Self-pay

## 2018-02-04 ENCOUNTER — Other Ambulatory Visit (HOSPITAL_COMMUNITY): Payer: Self-pay | Admitting: *Deleted

## 2018-02-04 ENCOUNTER — Other Ambulatory Visit: Payer: Self-pay | Admitting: Medical

## 2018-02-04 DIAGNOSIS — Z3A33 33 weeks gestation of pregnancy: Secondary | ICD-10-CM | POA: Diagnosis not present

## 2018-02-04 DIAGNOSIS — B1910 Unspecified viral hepatitis B without hepatic coma: Secondary | ICD-10-CM | POA: Diagnosis not present

## 2018-02-04 DIAGNOSIS — O98413 Viral hepatitis complicating pregnancy, third trimester: Secondary | ICD-10-CM | POA: Diagnosis not present

## 2018-02-04 DIAGNOSIS — O365990 Maternal care for other known or suspected poor fetal growth, unspecified trimester, not applicable or unspecified: Secondary | ICD-10-CM

## 2018-02-04 DIAGNOSIS — Z362 Encounter for other antenatal screening follow-up: Secondary | ICD-10-CM | POA: Diagnosis not present

## 2018-02-04 DIAGNOSIS — Z98891 History of uterine scar from previous surgery: Secondary | ICD-10-CM

## 2018-02-04 DIAGNOSIS — O34219 Maternal care for unspecified type scar from previous cesarean delivery: Secondary | ICD-10-CM | POA: Diagnosis not present

## 2018-02-10 ENCOUNTER — Ambulatory Visit (INDEPENDENT_AMBULATORY_CARE_PROVIDER_SITE_OTHER): Payer: Medicaid Other | Admitting: Medical

## 2018-02-10 ENCOUNTER — Encounter: Payer: Medicaid Other | Admitting: Medical

## 2018-02-10 ENCOUNTER — Encounter: Payer: Self-pay | Admitting: Medical

## 2018-02-10 VITALS — BP 103/66 | HR 102 | Wt 151.0 lb

## 2018-02-10 DIAGNOSIS — O365990 Maternal care for other known or suspected poor fetal growth, unspecified trimester, not applicable or unspecified: Secondary | ICD-10-CM

## 2018-02-10 DIAGNOSIS — Z3483 Encounter for supervision of other normal pregnancy, third trimester: Secondary | ICD-10-CM

## 2018-02-10 DIAGNOSIS — Z3480 Encounter for supervision of other normal pregnancy, unspecified trimester: Secondary | ICD-10-CM

## 2018-02-10 DIAGNOSIS — O365930 Maternal care for other known or suspected poor fetal growth, third trimester, not applicable or unspecified: Secondary | ICD-10-CM

## 2018-02-10 DIAGNOSIS — O34219 Maternal care for unspecified type scar from previous cesarean delivery: Secondary | ICD-10-CM

## 2018-02-10 NOTE — Patient Instructions (Addendum)
Fetal Movement Counts Patient Name: ________________________________________________ Patient Due Date: ____________________ What is a fetal movement count? A fetal movement count is the number of times that you feel your baby move during a certain amount of time. This may also be called a fetal kick count. A fetal movement count is recommended for every pregnant woman. You may be asked to start counting fetal movements as early as week 28 of your pregnancy. Pay attention to when your baby is most active. You may notice your baby's sleep and wake cycles. You may also notice things that make your baby move more. You should do a fetal movement count:  When your baby is normally most active.  At the same time each day.  A good time to count movements is while you are resting, after having something to eat and drink. How do I count fetal movements? 1. Find a quiet, comfortable area. Sit, or lie down on your side. 2. Write down the date, the start time and stop time, and the number of movements that you felt between those two times. Take this information with you to your health care visits. 3. For 2 hours, count kicks, flutters, swishes, rolls, and jabs. You should feel at least 10 movements during 2 hours. 4. You may stop counting after you have felt 10 movements. 5. If you do not feel 10 movements in 2 hours, have something to eat and drink. Then, keep resting and counting for 1 hour. If you feel at least 4 movements during that hour, you may stop counting. Contact a health care provider if:  You feel fewer than 4 movements in 2 hours.  Your baby is not moving like he or she usually does. Date: ____________ Start time: ____________ Stop time: ____________ Movements: ____________ Date: ____________ Start time: ____________ Stop time: ____________ Movements: ____________ Date: ____________ Start time: ____________ Stop time: ____________ Movements: ____________ Date: ____________ Start time:  ____________ Stop time: ____________ Movements: ____________ Date: ____________ Start time: ____________ Stop time: ____________ Movements: ____________ Date: ____________ Start time: ____________ Stop time: ____________ Movements: ____________ Date: ____________ Start time: ____________ Stop time: ____________ Movements: ____________ Date: ____________ Start time: ____________ Stop time: ____________ Movements: ____________ Date: ____________ Start time: ____________ Stop time: ____________ Movements: ____________ This information is not intended to replace advice given to you by your health care provider. Make sure you discuss any questions you have with your health care provider. Document Released: 11/12/2006 Document Revised: 06/11/2016 Document Reviewed: 11/22/2015 Elsevier Interactive Patient Education  2018 [employer].  [employer] of the uterus can occur throughout pregnancy, but they are not always a sign that you are in labor. You may have practice contractions called Braxton Hicks contractions. These false labor contractions are sometimes confused with true labor. What are [place] contractions? Braxton Hicks contractions are tightening movements that occur in the muscles of the uterus before labor. Unlike true labor contractions, these contractions do not result in opening (dilation) and thinning of the cervix. Toward the end of pregnancy (32-34 weeks), Braxton Hicks contractions can happen more often and may become stronger. These contractions are sometimes difficult to tell apart from true labor because they can be very uncomfortable. You should not feel embarrassed if you go to the hospital with false labor. Sometimes, the only way to tell if you are in true labor is for your health care provider to look for changes in the cervix. The health care provider will do a physical exam and may monitor your contractions.  If you are not in true labor, the exam  should show that your cervix is not dilating and your water has not broken. If there are other health problems associated with your pregnancy, it is completely safe for you to be sent home with false labor. You may continue to have Braxton Hicks contractions until you go into true labor. How to tell the difference between true labor and false labor True labor  Contractions last 30-70 seconds.  Contractions become very regular.  Discomfort is usually felt in the top of the uterus, and it spreads to the lower abdomen and low back.  Contractions do not go away with walking.  Contractions usually become more intense and increase in frequency.  The cervix dilates and gets thinner. False labor  Contractions are usually shorter and not as strong as true labor contractions.  Contractions are usually irregular.  Contractions are often felt in the front of the lower abdomen and in the groin.  Contractions may go away when you walk around or change positions while lying down.  Contractions get weaker and are shorter-lasting as time goes on.  The cervix usually does not dilate or become thin. Follow these instructions at home:  Take over-the-counter and prescription medicines only as told by your health care provider.  Keep up with your usual exercises and follow other instructions from your health care provider.  Eat and drink lightly if you think you are going into labor.  If Braxton Hicks contractions are making you uncomfortable: ? Change your position from lying down or resting to walking, or change from walking to resting. ? Sit and rest in a tub of warm water. ? Drink enough fluid to keep your urine pale yellow. Dehydration may cause these contractions. ? Do slow and deep breathing several times an hour.  Keep all follow-up prenatal visits as told by your health care provider. This is important. Contact a health care provider if:  You have a fever.  You have continuous pain  in your abdomen. Get help right away if:  Your contractions become stronger, more regular, and closer together.  You have fluid leaking or gushing from your vagina.  You pass blood-tinged mucus (bloody show).  You have bleeding from your vagina.  You have low back pain that you never had before.  You feel your baby's head pushing down and causing pelvic pressure.  Your baby is not moving inside you as much as it used to. Summary  Contractions that occur before labor are called Braxton Hicks contractions, false labor, or practice contractions.  Braxton Hicks contractions are usually shorter, weaker, farther apart, and less regular than true labor contractions. True labor contractions usually become progressively stronger and regular and they become more frequent.  Manage discomfort from Braxton Hicks contractions by changing position, resting in a warm bath, drinking plenty of water, or practicing deep breathing. This information is not intended to replace advice given to you by your health care provider. Make sure you discuss any questions you have with your health care provider. Document Released: 02/26/2017 Document Revised: 02/26/2017 Document Reviewed: 02/26/2017 Elsevier Interactive Patient Education  2018 Elsevier Inc.    Contraception Choices Contraception, also called birth control, means things to use or ways to try not to get pregnant. Hormonal birth control This kind of birth control uses hormones. Here are some types of hormonal birth control:  A tube that is put under skin of the arm (implant). The tube can stay in for as long   as 3 years.  Shots to get every 3 months (injections).  Pills to take every day (birth control pills).  A patch to change 1 time each week for 3 weeks (birth control patch). After that, the patch is taken off for 1 week.  A ring to put in the vagina. The ring is left in for 3 weeks. Then it is taken out of the vagina for 1 week. Then a  new ring is put in.  Pills to take after unprotected sex (emergency birth control pills).  Barrier birth control Here are some types of barrier birth control:  A thin covering that is put on the penis before sex (female condom). The covering is thrown away after sex.  A soft, loose covering that is put in the vagina before sex (female condom). The covering is thrown away after sex.  A rubber bowl that sits over the cervix (diaphragm). The bowl must be made for you. The bowl is put into the vagina before sex. The bowl is left in for 6-8 hours after sex. It is taken out within 24 hours.  A small, soft cup that fits over the cervix (cervical cap). The cup must be made for you. The cup can be left in for 6-8 hours after sex. It is taken out within 48 hours.  A sponge that is put into the vagina before sex. It must be left in for at least 6 hours after sex. It must be taken out within 30 hours. Then it is thrown away.  A chemical that kills or stops sperm from getting into the uterus (spermicide). It may be a pill, cream, jelly, or foam to put in the vagina. The chemical should be used at least 10-15 minutes before sex.  IUD (intrauterine) birth control An IUD is a small, T-shaped piece of plastic. It is put inside the uterus. There are two kinds:  Hormone IUD. This kind can stay in for 3-5 years.  Copper IUD. This kind can stay in for 10 years.  Permanent birth control Here are some types of permanent birth control:  Surgery to block the fallopian tubes.  Having an insert put into each fallopian tube.  Surgery to tie off the tubes that carry sperm (vasectomy).  Natural planning birth control Here are some types of natural planning birth control:  Not having sex on the days the woman could get pregnant.  Using a calendar: ? To keep track of the length of each period. ? To find out what days pregnancy can happen. ? To plan to not have sex on days when pregnancy can  happen.  Watching for symptoms of ovulation and not having sex during ovulation. One way the woman can check for ovulation is to check her temperature.  Waiting to have sex until after ovulation.  Summary  Contraception, also called birth control, means things to use or ways to try not to get pregnant.  Hormonal methods of birth control include implants, injections, pills, patches, vaginal rings, and emergency birth control pills.  Barrier methods of birth control can include female condoms, female condoms, diaphragms, cervical caps, sponges, and spermicides.  There are two types of IUD (intrauterine device) birth control. An IUD can be put in a woman's uterus to prevent pregnancy for 3-5 years.  Permanent sterilization can be done through a procedure for males, females, or both.  Natural planning methods involve not having sex on the days when the woman could get pregnant. This information is not intended   replace advice given to you by your health care provider. Make sure you discuss any questions you have with your health care provider. Document Released: 08/10/2009 Document Revised: 10/23/2016 Document Reviewed: 10/23/2016 Elsevier Interactive Patient Education  2017 [employer]

## 2018-02-10 NOTE — Progress Notes (Signed)
   PRENATAL VISIT NOTE  Subjective:  Morgan PriestLydia Richardson is a 32 y.o. G3P2002 at 8556w2d being seen today for ongoing prenatal care.  She is currently monitored for the following issues for this high-risk pregnancy and has Previous cesarean section complicating pregnancy; Encounter for supervision of normal pregnancy in multigravida, antepartum; Language barrier affecting health care; Hepatitis B surface antigen positive; and Pregnancy affected by fetal growth restriction on their problem list.  Patient reports no complaints.  Contractions: Not present. Vag. Bleeding: None.  Movement: Present. Denies leaking of fluid.   The following portions of the patient's history were reviewed and updated as appropriate: allergies, current medications, past family history, past medical history, past social history, past surgical history and problem list. Problem list updated.  Objective:   Vitals:   02/10/18 0911  BP: 103/66  Pulse: (!) 102  Weight: 151 lb (68.5 kg)    Fetal Status: Fetal Heart Rate (bpm): 140  Fundal Height: 34 cm Movement: Present     General:  Alert, oriented and cooperative. Patient is in no acute distress.  Skin: Skin is warm and dry. No rash noted.   Cardiovascular: Normal heart rate noted  Respiratory: Normal respiratory effort, no problems with respiration noted  Abdomen: Soft, gravid, appropriate for gestational age.  Pain/Pressure: Present     Pelvic: Cervical exam deferred        Extremities: Normal range of motion.  Edema: None  Mental Status: Normal mood and affect. Normal behavior. Normal judgment and thought content.   Assessment and Plan:  Pregnancy: G3P2002 at 6056w2d  1. Encounter for supervision of normal pregnancy in multigravida, antepartum  2. Pregnancy affected by fetal growth restriction - Has follow-up US for growth scheduled 03/01/18  3. Previous cesarean section complicating pregnancy - [facility], today patient  states she thinks it may be better to repeat C/S - Will schedule with MD at next visit to discuss fully and scheduled C/S if planned  Preterm labor symptoms and general obstetric precautions including but not limited to vaginal bleeding, contractions, leaking of fluid and fetal movement were reviewed in detail with the patient. Please refer to After Visit Summary for other counseling recommendations.  [facility] about 2 weeks (around 02[facility]24/2018[facility].  Future Appointments  Date Time Provider Department Center  03/01/2018 10:45 AM [facility] US 2 [facility]    Vonzella NippleJulie Prestyn Mahn, PA-C

## 2018-03-01 ENCOUNTER — Ambulatory Visit (HOSPITAL_COMMUNITY)
Admission: RE | Admit: 2018-03-01 | Discharge: 2018-03-01 | Disposition: A | Discharge status: Home / Self Care | Payer: Medicaid Other | Source: Ambulatory Visit | Attending: Medical | Admitting: Medical

## 2018-03-01 ENCOUNTER — Encounter (HOSPITAL_COMMUNITY): Payer: Self-pay

## 2018-03-01 DIAGNOSIS — B1910 Unspecified viral hepatitis B without hepatic coma: Secondary | ICD-10-CM | POA: Diagnosis not present

## 2018-03-01 DIAGNOSIS — Z362 Encounter for other antenatal screening follow-up: Secondary | ICD-10-CM | POA: Insufficient documentation

## 2018-03-01 DIAGNOSIS — O98413 Viral hepatitis complicating pregnancy, third trimester: Secondary | ICD-10-CM | POA: Insufficient documentation

## 2018-03-01 DIAGNOSIS — Z3A37 37 weeks gestation of pregnancy: Secondary | ICD-10-CM | POA: Insufficient documentation

## 2018-03-01 DIAGNOSIS — O34219 Maternal care for unspecified type scar from previous cesarean delivery: Secondary | ICD-10-CM | POA: Insufficient documentation

## 2018-03-02 ENCOUNTER — Encounter: Payer: Self-pay | Admitting: *Deleted

## 2018-03-02 ENCOUNTER — Encounter (HOSPITAL_COMMUNITY): Payer: Self-pay

## 2018-03-02 ENCOUNTER — Other Ambulatory Visit (HOSPITAL_COMMUNITY)
Admission: RE | Admit: 2018-03-02 | Discharge: 2018-03-02 | Disposition: A | Discharge status: Home / Self Care | Payer: Medicaid Other | Source: Ambulatory Visit | Attending: Obstetrics and Gynecology | Admitting: Obstetrics and Gynecology

## 2018-03-02 ENCOUNTER — Encounter: Payer: Self-pay | Admitting: Obstetrics and Gynecology

## 2018-03-02 ENCOUNTER — Ambulatory Visit (INDEPENDENT_AMBULATORY_CARE_PROVIDER_SITE_OTHER): Payer: Medicaid Other | Admitting: Obstetrics and Gynecology

## 2018-03-02 VITALS — BP 105/70 | HR 94 | Wt 155.2 lb

## 2018-03-02 DIAGNOSIS — O34219 Maternal care for unspecified type scar from previous cesarean delivery: Secondary | ICD-10-CM

## 2018-03-02 DIAGNOSIS — O365930 Maternal care for other known or suspected poor fetal growth, third trimester, not applicable or unspecified: Secondary | ICD-10-CM

## 2018-03-02 DIAGNOSIS — O365990 Maternal care for other known or suspected poor fetal growth, unspecified trimester, not applicable or unspecified: Secondary | ICD-10-CM | POA: Insufficient documentation

## 2018-03-02 DIAGNOSIS — Z789 Other specified health status: Secondary | ICD-10-CM

## 2018-03-02 DIAGNOSIS — Z3480 Encounter for supervision of other normal pregnancy, unspecified trimester: Secondary | ICD-10-CM

## 2018-03-02 DIAGNOSIS — R768 Other specified abnormal immunological findings in serum: Secondary | ICD-10-CM

## 2018-03-02 DIAGNOSIS — Z3483 Encounter for supervision of other normal pregnancy, third trimester: Secondary | ICD-10-CM

## 2018-03-02 NOTE — Progress Notes (Signed)
   PRENATAL VISIT NOTE  Subjective:  Morgan Richardson is a 32 y.o. G3P2002 at [redacted]w[redacted]d being seen today for ongoing prenatal care.  She is currently monitored for the following issues for this high-risk pregnancy and has Previous cesarean section complicating pregnancy; Encounter for supervision of normal pregnancy in multigravida, antepartum; Language barrier affecting health care; Hepatitis B surface antigen positive; and Pregnancy affected by fetal growth restriction on their problem list.  Patient reports no complaints.  Contractions: Not present. Vag. Bleeding: None.  Movement: Present. Denies leaking of fluid.   The following portions of the patient's history were reviewed and updated as appropriate: allergies, current medications, past family history, past medical history, past social history, past surgical history and problem list. Problem list updated.  Objective:   Vitals:   03/02/18 0923  BP: 105/70  Pulse: 94  Weight: 155 lb 3.2 oz (70.4 kg)    Fetal Status: Fetal Heart Rate (bpm): 146   Movement: Present     General:  Alert, oriented and cooperative. Patient is in no acute distress.  Skin: Skin is warm and dry. No rash noted.   Cardiovascular: Normal heart rate noted  Respiratory: Normal respiratory effort, no problems with respiration noted  Abdomen: Soft, gravid, appropriate for gestational age.  Pain/Pressure: Present     Pelvic: Cervical exam deferred        Extremities: Normal range of motion.  Edema: Trace  Mental Status: Normal mood and affect. Normal behavior. Normal judgment and thought content.   Assessment and Plan:  Pregnancy: G3P2002 at [redacted]w[redacted]d  1. Previous cesarean section complicating pregnancy 1st in [place], patient reports she was induced in [place] and then told she needed to have a c-section 2nd at [facility], was told at [facility] she needed to have repeat, they did not try to induce her labor Reviewed risks/benefits of TOLAC versus RCS in detail. Patient counseled regarding  potential vaginal delivery, chance of success, future implications, possible uterine rupture and need for urgent/emergent repeat cesarean. Counseled regarding potential need for repeat c-section for reasons unrelated to first c-section. Counseled regarding scheduled repeat cesarean including risks of bleeding, infection, damage to surrounding tissue, abnormal placentation, implications for future pregnancies. All questions answered. Patient desires repeat c-section, consent signed today  2. Hepatitis B surface antigen positive Reviewed labs with ID, patient has active Hep B infection Per ID, patient will be seen in ID clinic tomorrow and started on treatment Per patient, she was originally diagnosed in 2009 with prior pregnancy but has never been on treatment  3. Language barrier affecting health care Burmese translator used  4. Encounter for supervision of normal pregnancy in multigravida, antepartum GBS done today  5. Pregnancy affected by fetal growth restriction Short FL, AC normal at last scan Growth 62nd%tile   Term labor symptoms and general obstetric precautions including but not limited to vaginal bleeding, contractions, leaking of fluid and fetal movement were reviewed in detail with the patient. Please refer to After Visit Summary for other counseling recommendations.  Return in about 1 week (around 03/09/2018) for OB visit (MD).  Future Appointments  Date Time Provider Department Center  03/12/2018  9:15 AM Anyanwu, Jethro Bastos, MD [facility]    Conan Bowens, MD

## 2018-03-03 ENCOUNTER — Telehealth (HOSPITAL_COMMUNITY): Payer: Self-pay | Admitting: *Deleted

## 2018-03-03 ENCOUNTER — Encounter: Payer: Self-pay | Admitting: Internal Medicine

## 2018-03-03 ENCOUNTER — Ambulatory Visit (INDEPENDENT_AMBULATORY_CARE_PROVIDER_SITE_OTHER): Payer: Medicaid Other | Admitting: Internal Medicine

## 2018-03-03 DIAGNOSIS — B181 Chronic viral hepatitis B without delta-agent: Secondary | ICD-10-CM | POA: Diagnosis not present

## 2018-03-03 LAB — GC/CHLAMYDIA PROBE AMP (~~LOC~~) NOT AT ARMC
Chlamydia: NEGATIVE
Neisseria Gonorrhea: NEGATIVE

## 2018-03-03 MED ORDER — TENOFOVIR DISOPROXIL FUMARATE 300 MG PO TABS: 300.0000 mg | ORAL_TABLET | Freq: Every day | ORAL | 0 refills | Status: DC

## 2018-03-03 NOTE — Progress Notes (Signed)
[facility]      Reason for Consult: chronic hepatitis B    Referring Physician: Dr. Earlene Plater    Patient ID: Morgan Richardson, female    DOB: [birth year], 32 y.o.   MRN: [id number]  HPI:   Here for a new patient visit for hepatitis B.  She has known about having hepatitis B and is now about [redacted] weeks pregnant.  Her AST and ALT are wnl at 22 and 23 and she is E Ag positive with a viral load of 183,000,000 iU.  This is her third pregnancy and she has one son with hepatitis B.  She is unaware of any other family members with hepatitis B.  She speaks Burmese and an interpretor is used.  She has never been on treatment for hepatitis B and has not regularly followed a provider for hepatitis B.   Previous record reviewed in Epic as above with noted lab values.    Past Medical History:  Diagnosis Date  . Hepatitis B surface antigen positive   . PONV (postoperative nausea and vomiting)     Prior to Admission medications   Medication Sig Start Date End Date Taking? Authorizing Provider  Prenatal Vit-Fe Fumarate-FA (PREPLUS) 27-1 MG TABS Take 1 tablet by mouth daily. 09/29/17  Yes [provider]  acetaminophen (TYLENOL) 500 MG tablet Take 1,000 mg by mouth every 6 (six) hours as needed.    [provider]  simethicone (MYLICON) 125 MG chewable tablet Chew 125 mg by mouth every 6 (six) hours as needed for flatulence.    [provider]    No Known Allergies  Social History   Tobacco Use  . Smoking status: Never Smoker  . Smokeless tobacco: Never Used  Substance Use Topics  . Alcohol use: No    Frequency: Never  . Drug use: No    Family History  Problem Relation Age of Onset  . Hypertension Mother   . Kidney disease Mother   not aware if mother had hepatitis B.  Review of Systems  Constitutional: negative for fatigue and malaise Gastrointestinal: negative for diarrhea Integument/breast: negative for rash All other systems reviewed and  are negative    Constitutional: in no apparent distress and alert  Vitals:   03/03/18 1358  BP: 102/68  Pulse: 96  SpO2: 96%   EYES: anicteric ENMT:no thrush Cardiovascular: Cor RRR Respiratory: CTA B; normal respiratory effort GI: gravid Musculoskeletal: no pedal edema noted Skin: negatives: no rash Hematologic: no cervical lad  Labs: Lab Results  Component Value Date   WBC 9.7 01/08/2018   HGB 11.8 01/08/2018   HCT 36.9 01/08/2018   MCV 96 01/08/2018   PLT 185 01/08/2018   No results found for: CREATININE, BUN, NA, K, CL, CO2  Lab Results  Component Value Date   ALT 23 11/25/2017   AST 22 11/25/2017   ALKPHOS 87 11/25/2017   BILITOT 0.2 11/25/2017     Assessment: chronic hepatitis B.   For her pregnancy, she does have a significantly elevated hepatitis B DNA of 183 million and therefore treatment as prevention of mother-to-child transmission is indicated in the third trimester.  I will have her start tenofovir now and she can continue through her pregnancy. I discussed breastfeeding and ok from ID standpoint for breastfeeding with neonatal immunoglobulin and vaccine at birth and continuing the series.   She does not have significant active disease with LFTs wnl, though I do not know if there is any  fibrosis in her liver. After her pregnancy, I will do more evaluation with elastography and repeat labs at that time to see if she has active inflammation requiring continuous treatment.   She also will need HCC screening beginning at 32 years of age.  Plan: 1) tenofovir to start today  rtc in 2-3 months for elastography, labs

## 2018-03-03 NOTE — Telephone Encounter (Signed)
Preadmission screen  

## 2018-03-03 NOTE — Telephone Encounter (Signed)
Interpreter number [phone]

## 2018-03-04 ENCOUNTER — Encounter (HOSPITAL_COMMUNITY): Payer: Self-pay

## 2018-03-04 NOTE — Pre-Procedure Instructions (Signed)
[id number] interpreter number

## 2018-03-06 LAB — CULTURE, BETA STREP (GROUP B ONLY): Strep Gp B Culture: NEGATIVE

## 2018-03-11 NOTE — Patient Instructions (Signed)
Morgan Richardson  03/11/2018   Your procedure is scheduled on:  03/15/2018  Enter through the Main Entrance of [facility] at 1000 AM.  Pick up the phone at the desk and dial [phone]  Call this number if you have problems the morning of [phone]  Remember:   Do not eat food:(After Midnight) Desps de medianoche.  Do not drink clear liquids: (After Midnight) Desps de medianoche.  Take these medicines the morning of surgery with A SIP OF WATER: tenovir   Do not wear jewelry, make-up or nail polish.  Do not wear lotions, powders, or perfumes. Do not wear deodorant.  Do not shave 48 hours prior to surgery.  Do not bring valuables to the hospital.  [facility] is not   responsible for any belongings or valuables brought to the hospital.  Contacts, dentures or bridgework may not be worn into surgery.  Leave suitcase in the car. After surgery it may be brought to your room.  For patients admitted to the hospital, checkout time is 11:00 AM the day of              discharge.    N/A   Please read over the following fact sheets that you were given:   Surgical Site Infection Prevention

## 2018-03-12 ENCOUNTER — Ambulatory Visit (INDEPENDENT_AMBULATORY_CARE_PROVIDER_SITE_OTHER): Payer: Medicaid Other | Admitting: Obstetrics & Gynecology

## 2018-03-12 ENCOUNTER — Encounter (HOSPITAL_COMMUNITY)
Admission: RE | Admit: 2018-03-12 | Discharge: 2018-03-12 | Disposition: A | Discharge status: Home / Self Care | Payer: Medicaid Other | Source: Ambulatory Visit | Attending: Obstetrics & Gynecology | Admitting: Obstetrics & Gynecology

## 2018-03-12 VITALS — BP 107/67 | HR 82 | Wt 155.0 lb

## 2018-03-12 DIAGNOSIS — Z0183 Encounter for blood typing: Secondary | ICD-10-CM | POA: Insufficient documentation

## 2018-03-12 DIAGNOSIS — B181 Chronic viral hepatitis B without delta-agent: Secondary | ICD-10-CM

## 2018-03-12 DIAGNOSIS — O34219 Maternal care for unspecified type scar from previous cesarean delivery: Secondary | ICD-10-CM

## 2018-03-12 DIAGNOSIS — Z3480 Encounter for supervision of other normal pregnancy, unspecified trimester: Secondary | ICD-10-CM

## 2018-03-12 DIAGNOSIS — Z01812 Encounter for preprocedural laboratory examination: Secondary | ICD-10-CM | POA: Insufficient documentation

## 2018-03-12 LAB — TYPE AND SCREEN
ABO/RH(D): B POS
Antibody Screen: NEGATIVE

## 2018-03-12 LAB — CBC
HCT: 39.4 % (ref 36.0–46.0)
HEMOGLOBIN: 12.8 g/dL (ref 12.0–15.0)
MCH: 31.9 pg (ref 26.0–34.0)
MCHC: 32.5 g/dL (ref 30.0–36.0)
MCV: 98.3 fL (ref 78.0–100.0)
Platelets: 151 10*3/uL (ref 150–400)
RBC: 4.01 MIL/uL (ref 3.87–5.11)
RDW: 13.2 % (ref 11.5–15.5)
WBC: 8.1 10*3/uL (ref 4.0–10.5)

## 2018-03-12 LAB — ABO/RH: ABO/RH(D): B POS

## 2018-03-12 NOTE — Progress Notes (Signed)
   PRENATAL VISIT NOTE  Subjective:  Morgan Richardson is a 32 y.o. G3P2002 at [redacted]w[redacted]d being seen today for ongoing prenatal care. Due to language barrier, an interpreter was present during the history-taking and subsequent discussion (and for part of the physical exam) with this patient.  She is currently monitored for the following issues for this high-risk pregnancy and has Previous cesarean section complicating pregnancy; Encounter for supervision of normal pregnancy in multigravida, antepartum; Language barrier affecting health care; Hepatitis B surface antigen positive; and Chronic viral hepatitis B without delta-agent (HCC) on their problem list.  Patient reports no complaints.  Contractions: Irregular. Vag. Bleeding: None.  Movement: Present. Denies leaking of fluid.   The following portions of the patient's history were reviewed and updated as appropriate: allergies, current medications, past family history, past medical history, past social history, past surgical history and problem list. Problem list updated.  Objective:   Vitals:   03/12/18 0946  BP: 107/67  Pulse: 82  Weight: 155 lb (70.3 kg)    Fetal Status: Fetal Heart Rate (bpm): 152 Fundal Height: 37 cm Movement: Present     General:  Alert, oriented and cooperative. Patient is in no acute distress.  Skin: Skin is warm and dry. No rash noted.   Cardiovascular: Normal heart rate noted  Respiratory: Normal respiratory effort, no problems with respiration noted  Abdomen: Soft, gravid, appropriate for gestational age.  Pain/Pressure: Present     Pelvic: Cervical exam deferred        Extremities: Normal range of motion.  Edema: Trace  Mental Status: Normal mood and affect. Normal behavior. Normal judgment and thought content.   Assessment and Plan:  Pregnancy: G3P2002 at [redacted]w[redacted]d  1. Chronic viral hepatitis B without delta agent and without coma (HCC) Followed by ID, on treatment. Can breastfeed.   2. Previous cesarean section  complicating pregnancy RCS scheduled 03/15/18. Preop RN to see patient after this visit.  3. Encounter for supervision of normal pregnancy in multigravida, antepartum Term labor symptoms and general obstetric precautions including but not limited to vaginal bleeding, contractions, leaking of fluid and fetal movement were reviewed in detail with the patient. Please refer to After Visit Summary for other counseling recommendations.  Return in about 2 weeks (around 03/26/2018) for Cesarean section wound check with RN; then in 5 weeks from now: Postpartum check and Nexplanon.  Future Appointments  Date Time Provider Department Center  03/12/2018 11:15 AM [facility] PAT 5 [facility] None  05/03/2018  8:45 AM Comer, Belia Heman, MD [facility]    Jaynie Collins, MD

## 2018-03-12 NOTE — Patient Instructions (Signed)
Return to clinic for any scheduled appointments or obstetric concerns, or go to [facility] for evaluation  

## 2018-03-13 LAB — RPR: RPR: NONREACTIVE

## 2018-03-15 ENCOUNTER — Inpatient Hospital Stay (HOSPITAL_COMMUNITY): Payer: Medicaid Other | Admitting: Anesthesiology

## 2018-03-15 ENCOUNTER — Encounter (HOSPITAL_COMMUNITY): Payer: Self-pay | Admitting: *Deleted

## 2018-03-15 ENCOUNTER — Encounter (HOSPITAL_COMMUNITY)
Admission: RE | Disposition: A | Discharge status: Home / Self Care | Payer: Self-pay | Source: Ambulatory Visit | Attending: Obstetrics & Gynecology

## 2018-03-15 ENCOUNTER — Inpatient Hospital Stay (HOSPITAL_COMMUNITY)
Admission: RE | Admit: 2018-03-15 | Discharge: 2018-03-17 | DRG: 787 | Disposition: A | Discharge status: Home / Self Care | Payer: Medicaid Other | Source: Ambulatory Visit | Attending: Obstetrics & Gynecology | Admitting: Obstetrics & Gynecology

## 2018-03-15 DIAGNOSIS — B181 Chronic viral hepatitis B without delta-agent: Secondary | ICD-10-CM | POA: Diagnosis present

## 2018-03-15 DIAGNOSIS — O9842 Viral hepatitis complicating childbirth: Secondary | ICD-10-CM | POA: Diagnosis present

## 2018-03-15 DIAGNOSIS — Z3A39 39 weeks gestation of pregnancy: Secondary | ICD-10-CM

## 2018-03-15 DIAGNOSIS — E871 Hypo-osmolality and hyponatremia: Secondary | ICD-10-CM | POA: Diagnosis present

## 2018-03-15 DIAGNOSIS — Z789 Other specified health status: Secondary | ICD-10-CM | POA: Diagnosis present

## 2018-03-15 DIAGNOSIS — O34219 Maternal care for unspecified type scar from previous cesarean delivery: Secondary | ICD-10-CM | POA: Diagnosis present

## 2018-03-15 DIAGNOSIS — Z98891 History of uterine scar from previous surgery: Secondary | ICD-10-CM

## 2018-03-15 DIAGNOSIS — O99284 Endocrine, nutritional and metabolic diseases complicating childbirth: Secondary | ICD-10-CM | POA: Diagnosis present

## 2018-03-15 DIAGNOSIS — O34211 Maternal care for low transverse scar from previous cesarean delivery: Principal | ICD-10-CM | POA: Diagnosis present

## 2018-03-15 LAB — TYPE AND SCREEN
ABO/RH(D): B POS
Antibody Screen: NEGATIVE

## 2018-03-15 SURGERY — Surgical Case
Anesthesia: Spinal | Wound class: Clean Contaminated

## 2018-03-15 MED ORDER — OXYTOCIN 10 UNIT/ML IJ SOLN
INTRAMUSCULAR | Status: AC
  Filled 2018-03-15: qty 4

## 2018-03-15 MED ORDER — MORPHINE SULFATE (PF) 0.5 MG/ML IJ SOLN
INTRAMUSCULAR | Status: AC
Start: 2018-03-15 — End: ?
  Filled 2018-03-15: qty 10

## 2018-03-15 MED ORDER — MORPHINE SULFATE (PF) 0.5 MG/ML IJ SOLN
INTRAMUSCULAR | Status: DC | PRN
  Administered 2018-03-15: 200 ug via INTRATHECAL

## 2018-03-15 MED ORDER — KETOROLAC TROMETHAMINE 30 MG/ML IJ SOLN: 30.0000 mg | Freq: Four times a day (QID) | INTRAMUSCULAR | Status: AC | PRN

## 2018-03-15 MED ORDER — OXYTOCIN 10 UNIT/ML IJ SOLN
INTRAVENOUS | Status: DC | PRN
  Administered 2018-03-15: 40 [IU] via INTRAVENOUS

## 2018-03-15 MED ORDER — FENTANYL CITRATE (PF) 100 MCG/2ML IJ SOLN
INTRAMUSCULAR | Status: AC
  Filled 2018-03-15: qty 2

## 2018-03-15 MED ORDER — OXYCODONE HCL 5 MG PO TABS: 5.0000 mg | ORAL_TABLET | ORAL | Status: DC | PRN

## 2018-03-15 MED ORDER — CEFAZOLIN SODIUM-DEXTROSE 2-4 GM/100ML-% IV SOLN
2.0000 g | INTRAVENOUS | Status: AC
  Administered 2018-03-15: 2 g via INTRAVENOUS
  Filled 2018-03-15: qty 100

## 2018-03-15 MED ORDER — SIMETHICONE 80 MG PO CHEW
80.0000 mg | CHEWABLE_TABLET | ORAL | Status: DC
  Administered 2018-03-15: 80 mg via ORAL
  Filled 2018-03-15: qty 1

## 2018-03-15 MED ORDER — SIMETHICONE 80 MG PO CHEW: 80.0000 mg | CHEWABLE_TABLET | ORAL | Status: DC | PRN

## 2018-03-15 MED ORDER — OXYCODONE HCL 5 MG/5ML PO SOLN: 5.0000 mg | Freq: Once | ORAL | Status: DC | PRN

## 2018-03-15 MED ORDER — FENTANYL CITRATE (PF) 100 MCG/2ML IJ SOLN
INTRAMUSCULAR | Status: DC | PRN
  Administered 2018-03-15: 10 ug via INTRATHECAL

## 2018-03-15 MED ORDER — NALBUPHINE HCL 10 MG/ML IJ SOLN: 5.0000 mg | INTRAMUSCULAR | Status: DC | PRN

## 2018-03-15 MED ORDER — DIBUCAINE 1 % RE OINT: 1.0000 "application " | TOPICAL_OINTMENT | RECTAL | Status: DC | PRN

## 2018-03-15 MED ORDER — DIPHENHYDRAMINE HCL 50 MG/ML IJ SOLN: 12.5000 mg | INTRAMUSCULAR | Status: DC | PRN

## 2018-03-15 MED ORDER — DIPHENHYDRAMINE HCL 25 MG PO CAPS
25.0000 mg | ORAL_CAPSULE | Freq: Four times a day (QID) | ORAL | Status: DC | PRN
Start: 2018-03-15 — End: 2018-03-17

## 2018-03-15 MED ORDER — SCOPOLAMINE 1 MG/3DAYS TD PT72: 1.0000 | MEDICATED_PATCH | Freq: Once | TRANSDERMAL | Status: DC

## 2018-03-15 MED ORDER — DIPHENHYDRAMINE HCL 25 MG PO CAPS: 25.0000 mg | ORAL_CAPSULE | ORAL | Status: DC | PRN

## 2018-03-15 MED ORDER — TETANUS-DIPHTH-ACELL PERTUSSIS 5-2.5-18.5 LF-MCG/0.5 IM SUSP: 0.5000 mL | Freq: Once | INTRAMUSCULAR | Status: DC

## 2018-03-15 MED ORDER — LACTATED RINGERS IV SOLN
INTRAVENOUS | Status: DC | PRN
  Administered 2018-03-15: 13:00:00 via INTRAVENOUS

## 2018-03-15 MED ORDER — STERILE WATER FOR IRRIGATION IR SOLN
Status: DC | PRN
  Administered 2018-03-15: 1000 mL

## 2018-03-15 MED ORDER — ACETAMINOPHEN 325 MG PO TABS: 650.0000 mg | ORAL_TABLET | ORAL | Status: DC | PRN

## 2018-03-15 MED ORDER — PROMETHAZINE HCL 25 MG/ML IJ SOLN: 6.2500 mg | INTRAMUSCULAR | Status: DC | PRN

## 2018-03-15 MED ORDER — FAMOTIDINE 20 MG PO TABS
20.0000 mg | ORAL_TABLET | Freq: Once | ORAL | Status: AC
  Administered 2018-03-15: 20 mg via ORAL
  Filled 2018-03-15: qty 1

## 2018-03-15 MED ORDER — BUPIVACAINE IN DEXTROSE 0.75-8.25 % IT SOLN
INTRATHECAL | Status: DC | PRN
  Administered 2018-03-15: 1.6 mL via INTRATHECAL

## 2018-03-15 MED ORDER — ONDANSETRON HCL 4 MG/2ML IJ SOLN
INTRAMUSCULAR | Status: AC
  Filled 2018-03-15: qty 2

## 2018-03-15 MED ORDER — MENTHOL 3 MG MT LOZG: 1.0000 | LOZENGE | OROMUCOSAL | Status: DC | PRN

## 2018-03-15 MED ORDER — SCOPOLAMINE 1 MG/3DAYS TD PT72
1.0000 | MEDICATED_PATCH | Freq: Once | TRANSDERMAL | Status: DC
  Administered 2018-03-15: 1.5 mg via TRANSDERMAL
  Filled 2018-03-15: qty 1

## 2018-03-15 MED ORDER — TENOFOVIR DISOPROXIL FUMARATE 300 MG PO TABS
300.0000 mg | ORAL_TABLET | Freq: Every day | ORAL | Status: DC
  Administered 2018-03-16 – 2018-03-17 (×2): 300 mg via ORAL
  Filled 2018-03-15 (×3): qty 1

## 2018-03-15 MED ORDER — SIMETHICONE 80 MG PO CHEW
80.0000 mg | CHEWABLE_TABLET | Freq: Three times a day (TID) | ORAL | Status: DC
  Administered 2018-03-16 – 2018-03-17 (×4): 80 mg via ORAL
  Filled 2018-03-15 (×4): qty 1

## 2018-03-15 MED ORDER — NALOXONE HCL 0.4 MG/ML IJ SOLN: 0.4000 mg | INTRAMUSCULAR | Status: DC | PRN

## 2018-03-15 MED ORDER — DEXAMETHASONE SODIUM PHOSPHATE 4 MG/ML IJ SOLN
INTRAMUSCULAR | Status: DC | PRN
  Administered 2018-03-15: 4 mg via INTRAVENOUS

## 2018-03-15 MED ORDER — SOD CITRATE-CITRIC ACID 500-334 MG/5ML PO SOLN
30.0000 mL | ORAL | Status: AC
  Administered 2018-03-15: 30 mL via ORAL
  Filled 2018-03-15: qty 15

## 2018-03-15 MED ORDER — PHENYLEPHRINE HCL 10 MG/ML IJ SOLN
INTRAMUSCULAR | Status: DC | PRN
  Administered 2018-03-15 (×7): 80 ug via INTRAVENOUS

## 2018-03-15 MED ORDER — PHENYLEPHRINE 8 MG IN D5W 100 ML (0.08MG/ML) PREMIX OPTIME
INJECTION | INTRAVENOUS | Status: DC | PRN
  Administered 2018-03-15: 70 ug/min via INTRAVENOUS

## 2018-03-15 MED ORDER — EPHEDRINE 5 MG/ML INJ
INTRAVENOUS | Status: AC
  Filled 2018-03-15: qty 4

## 2018-03-15 MED ORDER — SODIUM CHLORIDE 0.9% FLUSH: 3.0000 mL | INTRAVENOUS | Status: DC | PRN

## 2018-03-15 MED ORDER — BUPIVACAINE HCL (PF) 0.5 % IJ SOLN
INTRAMUSCULAR | Status: AC
  Filled 2018-03-15: qty 30

## 2018-03-15 MED ORDER — COCONUT OIL OIL: 1.0000 "application " | TOPICAL_OIL | Status: DC | PRN

## 2018-03-15 MED ORDER — PRENATAL MULTIVITAMIN CH
1.0000 | ORAL_TABLET | Freq: Every day | ORAL | Status: DC
  Administered 2018-03-16 – 2018-03-17 (×2): 1 via ORAL
  Filled 2018-03-15 (×2): qty 1

## 2018-03-15 MED ORDER — FENTANYL CITRATE (PF) 100 MCG/2ML IJ SOLN: 25.0000 ug | INTRAMUSCULAR | Status: DC | PRN

## 2018-03-15 MED ORDER — NALBUPHINE HCL 10 MG/ML IJ SOLN: 5.0000 mg | Freq: Once | INTRAMUSCULAR | Status: DC | PRN

## 2018-03-15 MED ORDER — DEXAMETHASONE SODIUM PHOSPHATE 4 MG/ML IJ SOLN
INTRAMUSCULAR | Status: AC
  Filled 2018-03-15: qty 1

## 2018-03-15 MED ORDER — MEPERIDINE HCL 25 MG/ML IJ SOLN: 6.2500 mg | INTRAMUSCULAR | Status: DC | PRN

## 2018-03-15 MED ORDER — OXYCODONE HCL 5 MG PO TABS: 10.0000 mg | ORAL_TABLET | ORAL | Status: DC | PRN

## 2018-03-15 MED ORDER — SODIUM CHLORIDE 0.9 % IR SOLN
Status: DC | PRN
  Administered 2018-03-15: 1

## 2018-03-15 MED ORDER — LACTATED RINGERS IV SOLN
INTRAVENOUS | Status: DC
  Administered 2018-03-15 – 2018-03-16 (×3): via INTRAVENOUS

## 2018-03-15 MED ORDER — ENOXAPARIN SODIUM 40 MG/0.4ML ~~LOC~~ SOLN
40.0000 mg | SUBCUTANEOUS | Status: DC
  Administered 2018-03-16 – 2018-03-17 (×2): 40 mg via SUBCUTANEOUS
  Filled 2018-03-15 (×2): qty 0.4

## 2018-03-15 MED ORDER — ONDANSETRON HCL 4 MG/2ML IJ SOLN: 4.0000 mg | Freq: Three times a day (TID) | INTRAMUSCULAR | Status: DC | PRN

## 2018-03-15 MED ORDER — LACTATED RINGERS IV SOLN
INTRAVENOUS | Status: DC
  Administered 2018-03-15 (×3): via INTRAVENOUS

## 2018-03-15 MED ORDER — NALOXONE HCL 4 MG/10ML IJ SOLN: 1.0000 ug/kg/h | INTRAVENOUS | Status: DC | PRN

## 2018-03-15 MED ORDER — BUPIVACAINE HCL (PF) 0.5 % IJ SOLN
INTRAMUSCULAR | Status: DC | PRN
  Administered 2018-03-15: 30 mL

## 2018-03-15 MED ORDER — WITCH HAZEL-GLYCERIN EX PADS: 1.0000 "application " | MEDICATED_PAD | CUTANEOUS | Status: DC | PRN

## 2018-03-15 MED ORDER — EPHEDRINE 5 MG/ML INJ
10.0000 mg | Freq: Once | INTRAVENOUS | Status: AC
  Administered 2018-03-15: 10 mg via INTRAVENOUS

## 2018-03-15 MED ORDER — SOD CITRATE-CITRIC ACID 500-334 MG/5ML PO SOLN: 30.0000 mL | Freq: Once | ORAL | Status: DC

## 2018-03-15 MED ORDER — OXYCODONE HCL 5 MG PO TABS: 5.0000 mg | ORAL_TABLET | Freq: Once | ORAL | Status: DC | PRN

## 2018-03-15 MED ORDER — SENNOSIDES-DOCUSATE SODIUM 8.6-50 MG PO TABS
2.0000 | ORAL_TABLET | ORAL | Status: DC
  Administered 2018-03-15 – 2018-03-16 (×2): 2 via ORAL
  Filled 2018-03-15 (×2): qty 2

## 2018-03-15 MED ORDER — METOCLOPRAMIDE HCL 5 MG/ML IJ SOLN
INTRAMUSCULAR | Status: AC
  Filled 2018-03-15: qty 2

## 2018-03-15 MED ORDER — OXYTOCIN 40 UNITS IN LACTATED RINGERS INFUSION - SIMPLE MED: 2.5000 [IU]/h | INTRAVENOUS | Status: AC

## 2018-03-15 MED ORDER — METOCLOPRAMIDE HCL 5 MG/ML IJ SOLN
INTRAMUSCULAR | Status: DC | PRN
  Administered 2018-03-15: 10 mg via INTRAVENOUS

## 2018-03-15 MED ORDER — ONDANSETRON HCL 4 MG/2ML IJ SOLN
INTRAMUSCULAR | Status: DC | PRN
  Administered 2018-03-15: 4 mg via INTRAVENOUS

## 2018-03-15 MED ORDER — IBUPROFEN 600 MG PO TABS
600.0000 mg | ORAL_TABLET | Freq: Four times a day (QID) | ORAL | Status: DC
  Administered 2018-03-15 – 2018-03-17 (×8): 600 mg via ORAL
  Filled 2018-03-15 (×7): qty 1

## 2018-03-15 MED ORDER — ZOLPIDEM TARTRATE 5 MG PO TABS: 5.0000 mg | ORAL_TABLET | Freq: Every evening | ORAL | Status: DC | PRN

## 2018-03-15 MED ORDER — PHENYLEPHRINE 40 MCG/ML (10ML) SYRINGE FOR IV PUSH (FOR BLOOD PRESSURE SUPPORT)
PREFILLED_SYRINGE | INTRAVENOUS | Status: AC
  Filled 2018-03-15: qty 10

## 2018-03-15 SURGICAL SUPPLY — 32 items
BARRIER ADHS 3X4 INTERCEED (GAUZE/BANDAGES/DRESSINGS) IMPLANT
CHLORAPREP W/TINT 26ML (MISCELLANEOUS) ×3 IMPLANT
CLAMP CORD UMBIL (MISCELLANEOUS) IMPLANT
CLOTH BEACON ORANGE TIMEOUT ST (SAFETY) ×3 IMPLANT
DRSG OPSITE POSTOP 4X10 (GAUZE/BANDAGES/DRESSINGS) ×3 IMPLANT
ELECT REM PT RETURN 9FT ADLT (ELECTROSURGICAL) ×3
ELECTRODE REM PT RTRN 9FT ADLT (ELECTROSURGICAL) ×1 IMPLANT
EXTRACTOR VACUUM KIWI (MISCELLANEOUS) ×3 IMPLANT
GAUZE SPONGE 4X4 12PLY STRL LF (GAUZE/BANDAGES/DRESSINGS) ×6 IMPLANT
GLOVE BIO SURGEON STRL SZ 6.5 (GLOVE) ×2 IMPLANT
GLOVE BIO SURGEONS STRL SZ 6.5 (GLOVE) ×1
GLOVE BIOGEL PI IND STRL 7.0 (GLOVE) ×2 IMPLANT
GLOVE BIOGEL PI INDICATOR 7.0 (GLOVE) ×4
GOWN STRL REUS W/TWL LRG LVL3 (GOWN DISPOSABLE) ×6 IMPLANT
KIT ABG SYR 3ML LUER SLIP (SYRINGE) IMPLANT
NEEDLE HYPO 22GX1.5 SAFETY (NEEDLE) IMPLANT
NEEDLE HYPO 25X5/8 SAFETYGLIDE (NEEDLE) IMPLANT
NS IRRIG 1000ML POUR BTL (IV SOLUTION) ×3 IMPLANT
PACK C SECTION WH (CUSTOM PROCEDURE TRAY) ×3 IMPLANT
PAD ABD 7.5X8 STRL (GAUZE/BANDAGES/DRESSINGS) ×3 IMPLANT
PAD OB MATERNITY 4.3X12.25 (PERSONAL CARE ITEMS) ×3 IMPLANT
PENCIL SMOKE EVAC W/HOLSTER (ELECTROSURGICAL) ×3 IMPLANT
RETRACTOR WND ALEXIS 25 LRG (MISCELLANEOUS) IMPLANT
RTRCTR WOUND ALEXIS 25CM LRG (MISCELLANEOUS)
SPONGE LAP 18X18 X RAY DECT (DISPOSABLE) ×3 IMPLANT
SUT VIC AB 0 CT1 36 (SUTURE) ×18 IMPLANT
SUT VIC AB 2-0 CT1 27 (SUTURE) ×2
SUT VIC AB 2-0 CT1 TAPERPNT 27 (SUTURE) ×1 IMPLANT
SUT VIC AB 4-0 PS2 27 (SUTURE) ×3 IMPLANT
SYR CONTROL 10ML LL (SYRINGE) IMPLANT
TOWEL OR 17X24 6PK STRL BLUE (TOWEL DISPOSABLE) ×3 IMPLANT
TRAY FOLEY W/BAG SLVR 14FR LF (SET/KITS/TRAYS/PACK) IMPLANT

## 2018-03-15 NOTE — Anesthesia Procedure Notes (Signed)
Spinal  Patient location during procedure: OR Start time: 03/15/2018 12:19 PM End time: 03/15/2018 12:23 PM Staffing Anesthesiologist: Beryle Lathe, MD Performed: anesthesiologist and other anesthesia staff  Preanesthetic Checklist Completed: patient identified, surgical consent, pre-op evaluation, timeout performed, IV checked, risks and benefits discussed and monitors and equipment checked Spinal Block Patient position: sitting Prep: DuraPrep Patient monitoring: heart rate, cardiac monitor, continuous pulse ox and blood pressure Approach: midline Location: L3-4 Injection technique: single-shot Needle Needle type: Pencan  Needle gauge: 24 G Additional Notes Functioning IV was confirmed and monitors were applied. Sterile prep and drape, including hand hygiene, mask, and sterile gloves were used. The patient was positioned and the spine was prepped. The skin was anesthetized with lidocaine. Free flow of clear CSF was obtained prior to injecting local anesthetic into the CSF. The spinal needle aspirated freely following injection. The needle was carefully withdrawn. The patient tolerated the procedure well. Consent was obtained prior to the procedure with all questions answered and concerns addressed. Risks including, but not limited to, bleeding, infection, nerve damage, paralysis, failed block, inadequate analgesia, allergic reaction, high spinal, itching, and headache were discussed and the patient wished to proceed. Spinal performed by Carlus Pavlov, supervised by Dr. Mal Amabile.  Leslye Peer, MD

## 2018-03-15 NOTE — Transfer of Care (Signed)
Immediate Anesthesia Transfer of Care Note  Patient: Morgan Richardson  Procedure(s) Performed: REPEAT CESAREAN SECTION (N/A )  Patient Location: PACU  Anesthesia Type:Spinal  Level of Consciousness: awake, alert  and oriented  Airway & Oxygen Therapy: Patient Spontanous Breathing  Post-op Assessment: Report given to RN and Post -op Vital signs reviewed and stable  Post vital signs: Reviewed and stable  Last Vitals:  Vitals Value Taken Time  BP 101/66 03/15/2018  1:56 PM  Temp    Pulse 75 03/15/2018  1:57 PM  Resp 17 03/15/2018  1:57 PM  SpO2 99 % 03/15/2018  1:57 PM  Vitals shown include unvalidated device data.  Last Pain:  Vitals:   03/15/18 1023  TempSrc: Oral  PainSc: 0-No pain      Patients Stated Pain Goal: 4 (03/15/18 1023)  Complications: No apparent anesthesia complications

## 2018-03-15 NOTE — Anesthesia Preprocedure Evaluation (Signed)
Anesthesia Evaluation  Patient identified by MRN, date of birth, ID band Patient awake    Reviewed: Allergy & Precautions, NPO status , Patient's Chart, lab work & pertinent test results  History of Anesthesia Complications (+) PONV  Airway Mallampati: II  TM Distance: >3 FB Neck ROM: Full    Dental  (+) Dental Advisory Given   Pulmonary neg pulmonary ROS,    breath sounds clear to auscultation       Cardiovascular negative cardio ROS   Rhythm:Regular Rate:Normal     Neuro/Psych negative neurological ROS  negative psych ROS   GI/Hepatic negative GI ROS, (+) Hepatitis -, B  Endo/Other  negative endocrine ROS  Renal/GU negative Renal ROS  negative genitourinary   Musculoskeletal negative musculoskeletal ROS (+)   Abdominal   Peds  Hematology negative hematology ROS (+)   Anesthesia Other Findings   Reproductive/Obstetrics (+) Pregnancy                             Anesthesia Physical Anesthesia Plan  ASA: II  Anesthesia Plan: Spinal   Post-op Pain Management:    Induction:   PONV Risk Score and Plan: Treatment may vary due to age or medical condition and Propofol infusion  Airway Management Planned: Natural Airway and Simple Face Mask  Additional Equipment: None  Intra-op Plan:   Post-operative Plan:   Informed Consent: I have reviewed the patients History and Physical, chart, labs and discussed the procedure including the risks, benefits and alternatives for the proposed anesthesia with the patient or authorized representative who has indicated his/her understanding and acceptance.     Plan Discussed with: CRNA and Anesthesiologist  Anesthesia Plan Comments:         Anesthesia Quick Evaluation

## 2018-03-15 NOTE — Addendum Note (Signed)
Addendum  created 03/15/18 1647 by Beryle Lathe, MD   Order list changed, Order sets accessed

## 2018-03-15 NOTE — Op Note (Signed)
Cesarean Section Operative Report  PATIENT: Morgan Richardson  PROCEDURE DATE: 03/15/2018  PREOPERATIVE DIAGNOSES: Intrauterine pregnancy at [redacted]w[redacted]d weeks gestation; Prior Cesaran Section x 2  POSTOPERATIVE DIAGNOSES: The same  PROCEDURE: Repeat Low Transverse Cesarean Section  SURGEON:   Surgeon(s) and Role:    Adam Phenix, MD - Primary    * Elery Cadenhead, Kandra Nicolas, MD - Assisting - OB Fellow   INDICATIONS: Morgan Richardson is a 31 y.o. G3P2002 at [redacted]w[redacted]d here for cesarean section secondary to the indications listed under preoperative diagnoses; please see preoperative note for further details.  The risks of cesarean section were discussed with the patient including but were not limited to: bleeding which may require transfusion or reoperation; infection which may require antibiotics; injury to bowel, bladder, ureters or other surrounding organs; injury to the fetus; need for additional procedures including hysterectomy in the event of a life-threatening hemorrhage; placental abnormalities wth subsequent pregnancies, incisional problems, thromboembolic phenomenon and other postoperative/anesthesia complications.   The patient concurred with the proposed plan, giving informed written consent for the procedure.    FINDINGS:  Viable female infant in cephalic presentation.  Apgars 8 and 9.  Clear amniotic fluid.  Intact placenta, three vessel cord.  Normal uterus, fallopian tubes and ovaries bilaterally.  ANESTHESIA: Spinal INTRAVENOUS FLUIDS: 2250  ESTIMATED BLOOD LOSS: 640 mL URINE OUTPUT:  250 ml SPECIMENS: Placenta sent to L&D COMPLICATIONS: None immediate  PROCEDURE IN DETAIL:  The patient preoperatively received intravenous antibiotics and had sequential compression devices applied to her lower extremities.  She was then taken to the operating room where spinal anesthesia was administered and was found to be adequate. She was then placed in a dorsal supine position with a leftward tilt, and prepped  and draped in a sterile manner.  A foley catheter was placed into her bladder and attached to constant gravity.    After an adequate timeout was performed, a Pfannenstiel skin incision was made with scalpel over her preexisting scar and carried through to the underlying layer of fascia. The fascia was incised in the midline, and this incision was extended bilaterally using the Mayo scissors.  Kocher clamps were applied to the superior aspect of the fascial incision and the underlying rectus muscles were dissected off bluntly and with sharp incision. Dissection was difficult d/t scar tissue.  A similar process was carried out on the inferior aspect of the fascial incision. The rectus muscles were separated in the midline bluntly and the peritoneum was entered bluntly. Alexis retractor was placed. Attention was turned to the lower uterine segment where a low transverse hysterotomy was made with a scalpel and extended bilaterally bluntly.  The infant was successfully delivered, the cord was clamped and cut after one minute, and the infant was handed over to the awaiting neonatology team. Uterine massage was then administered, and the placenta delivered intact with a three-vessel cord. The uterus was then cleared of clots and debris.  The hysterotomy was closed with 0 Vicryl in a running locked fashion, and an imbricating layer was also placed with 0 Vicryl.  The pelvis was cleared of all clot and debris. Hemostasis was confirmed on all surfaces.  The peritoneum was closed with a 2-0 Vicryl running stitch. An additional stitch had to be placed to help with hemostasis. Electrocautery also used to help with hemostasis on rectus muscles The fascia was then closed using 0 Vicryl in a running fashion.  The subcutaneous layer was irrigated, and 30 ml of 0.5% Marcaine  was injected subcutaneously around the incision.  The skin was closed with a 4-0 Vicryl subcuticular stitch. Honeycomb and pressure dressing applied.  The  patient tolerated the procedure well. Sponge, lap, instrument and needle counts were correct x 3.  She was taken to the recovery room in stable condition.   An experienced assistant was required given the standard of surgical care given the complexity of the case.  This assistant was needed for exposure, dissection, suctioning, retraction, instrument exchange, assisting with delivery with administration of fundal pressure, and for overall help during the procedure.   Maternal Disposition: PACU - hemodynamically stable.   Infant Disposition: skin-to-skin with mother - stable   Signed: Frederik Pear, MD OB Fellow 03/15/2018 2:00 PM

## 2018-03-15 NOTE — Lactation Note (Signed)
This note was copied from a baby's chart. Lactation Consultation Note  Patient Name: Morgan Richardson [id number] Date: 03/15/2018 Reason for consult: Initial assessment;Term  This mother speaks Burmese so interpreter used: Darl Pikes [phone]  Mother has prior breastfeeding experience and has no questions/concerns at this time.  Infant is swaddled and being held by a family member.  Encouraged mother to feed 8-12 times/24 hours or earlier if baby is showing feeding cues.  Continue STS, breast massage and hand expression.  Taught mother hand expression and colostrum flowing easily from breasts.  Colostrum container provided with instructions to feed back any EBM she may obtain.  Mother's breasts are soft and non tender and nipples are everted with no breakdown noted.  Mom made aware of O/P services, breastfeeding support groups, community resources, and our phone # for post-discharge questions. Mother will call as needed for assistance.    Maternal Data Formula Feeding for Exclusion: No Has patient been taught Hand Expression?: Yes Does the patient have breastfeeding experience prior to this delivery?: Yes  Feeding Feeding Type: Breast Fed  LATCH Score Latch: Repeated attempts needed to sustain latch, nipple held in mouth throughout feeding, stimulation needed to elicit sucking reflex.  Audible Swallowing: A few with stimulation  Type of Nipple: Everted at rest and after stimulation  Comfort (Breast/Nipple): Soft / non-tender  Hold (Positioning): Assistance needed to correctly position infant at breast and maintain latch.  LATCH Score: 7  Interventions    Lactation Tools Discussed/Used     Consult Status Consult Status: Follow-up Date: 03/16/18 Follow-up type: In-patient    Morgan Richardson 03/15/2018, 8:07 PM

## 2018-03-15 NOTE — Anesthesia Postprocedure Evaluation (Signed)
Anesthesia Post Note  Patient: Morgan Richardson  Procedure(s) Performed: REPEAT CESAREAN SECTION (N/A )     Patient location during evaluation: PACU Anesthesia Type: Spinal Level of consciousness: awake and alert Pain management: pain level controlled Vital Signs Assessment: post-procedure vital signs reviewed and stable Respiratory status: spontaneous breathing and respiratory function stable Cardiovascular status: blood pressure returned to baseline and stable Postop Assessment: spinal receding and no apparent nausea or vomiting Anesthetic complications: no    Last Vitals:  Vitals:   03/15/18 1530 03/15/18 1544  BP: 99/72 91/64  Pulse: 65 (!) 58  Resp: 15 18  Temp:  (!) 36.4 C  SpO2: 99% 100%    Last Pain:  Vitals:   03/15/18 1544  TempSrc: Oral  PainSc:    Pain Goal: Patients Stated Pain Goal: 4 (03/15/18 1023)               Beryle Lathe

## 2018-03-15 NOTE — H&P (Signed)
Morgan Richardson is a 32 y.o. female presenting for elective repeat cesarean section [redacted]w[redacted]d G3P2002 . OB History    Gravida  3   Para  2   Term  2   Preterm      AB      Living  2     SAB      TAB      Ectopic      Multiple      Live Births  2          Past Medical History:  Diagnosis Date  . Hepatitis B surface antigen positive   . PONV (postoperative nausea and vomiting)    Past Surgical History:  Procedure Laterality Date  . CESAREAN SECTION     Family History: family history includes Hypertension in her mother; Kidney disease in her mother. Social History:  reports that she has never smoked. She has never used smokeless tobacco. She reports that she does not drink alcohol or use drugs.     Maternal Diabetes: No Genetic Screening: Normal Maternal Ultrasounds/Referrals: Normal Fetal Ultrasounds or other Referrals:  None Maternal Substance Abuse:  No Significant Maternal Medications:  None Significant Maternal Lab Results:  Chronic hep B Other Comments:  None  Review of Systems  Constitutional: Negative.   Respiratory: Negative.   Cardiovascular: Negative.   Gastrointestinal: Negative.   Genitourinary: Negative.    Maternal Medical History:  Reason for admission: Repeat cesarean section  Fetal activity: Perceived fetal activity is normal.   Last perceived fetal movement was within the past hour.    Prenatal complications: no prenatal complications Prenatal Complications - Diabetes: none.      Blood pressure 104/67, pulse 78, temperature 98 F (36.7 C), temperature source Oral, resp. rate 18, height  (1.6 m), weight 70.3 kg (155 lb), last menstrual period 06/15/2017. Maternal Exam:  Abdomen: Patient reports no abdominal tenderness. Surgical scars: low transverse.   Introitus: not evaluated.   Cervix: not evaluated.   Physical Exam  Vitals reviewed. Constitutional: She is oriented to person, place, and time. She appears  well-developed.  HENT:  Head: Normocephalic.  Eyes: Pupils are equal, round, and reactive to light.  Neck: Normal range of motion.  Cardiovascular: Normal rate.  Respiratory: Effort normal. No respiratory distress.  Neurological: She is alert and oriented to person, place, and time.  Skin: Skin is warm and dry.  Psychiatric: She has a normal mood and affect.    Prenatal labs: ABO, Rh: --/--/B POS (05/20 1046) Antibody: NEG (05/20 1046) Rubella: 3.02 (12/04 1121) RPR: Non Reactive (05/17 1055)  HBsAg: Confirm. indicated (12/04 1121)  HIV: Non Reactive (03/15 0831)  GBS:     Assessment/Plan: Elective repeat cesarean section [redacted]w[redacted]d The risks of cesarean section discussed with the patient included but were not limited to: bleeding which may require transfusion or reoperation; infection which may require antibiotics; injury to bowel, bladder, ureters or other surrounding organs; injury to the fetus; need for additional procedures including hysterectomy in the event of a life-threatening hemorrhage; placental abnormalities wth subsequent pregnancies, incisional problems, thromboembolic phenomenon and other postoperative/anesthesia complications. The patient concurred with the proposed plan, giving informed written consent for the procedure.   Patient has been NPO since 0000 she will remain NPO for procedure. Anesthesia and OR aware. Preoperative prophylactic antibiotics and SCDs ordered on call to the OR.  To OR when ready.     Scheryl Darter 03/15/2018, 11:48 AM

## 2018-03-16 ENCOUNTER — Encounter (HOSPITAL_COMMUNITY): Payer: Self-pay | Admitting: Obstetrics & Gynecology

## 2018-03-16 DIAGNOSIS — E871 Hypo-osmolality and hyponatremia: Secondary | ICD-10-CM | POA: Clinically undetermined

## 2018-03-16 LAB — CBC
HCT: 30.3 % — ABNORMAL LOW (ref 36.0–46.0)
HCT: 30.6 % — ABNORMAL LOW (ref 36.0–46.0)
Hemoglobin: 10.2 g/dL — ABNORMAL LOW (ref 12.0–15.0)
Hemoglobin: 10.2 g/dL — ABNORMAL LOW (ref 12.0–15.0)
MCH: 32.3 pg (ref 26.0–34.0)
MCH: 31.9 pg (ref 26.0–34.0)
MCHC: 33.7 g/dL (ref 30.0–36.0)
MCHC: 33.3 g/dL (ref 30.0–36.0)
MCV: 95.9 fL (ref 78.0–100.0)
MCV: 95.6 fL (ref 78.0–100.0)
PLATELETS: 114 10*3/uL — AB (ref 150–400)
PLATELETS: 101 10*3/uL — AB (ref 150–400)
RBC: 3.16 MIL/uL — ABNORMAL LOW (ref 3.87–5.11)
RBC: 3.2 MIL/uL — AB (ref 3.87–5.11)
RDW: 12.8 % (ref 11.5–15.5)
RDW: 12.8 % (ref 11.5–15.5)
WBC: 11.9 10*3/uL — AB (ref 4.0–10.5)
WBC: 9.8 10*3/uL (ref 4.0–10.5)

## 2018-03-16 LAB — BASIC METABOLIC PANEL
Anion gap: 7 (ref 5–15)
BUN: 9 mg/dL (ref 6–20)
CALCIUM: 7.9 mg/dL — AB (ref 8.9–10.3)
CO2: 24 mmol/L (ref 22–32)
CREATININE: 0.68 mg/dL (ref 0.44–1.00)
Chloride: 96 mmol/L — ABNORMAL LOW (ref 101–111)
GLUCOSE: 90 mg/dL (ref 65–99)
Potassium: 3.5 mmol/L (ref 3.5–5.1)
Sodium: 127 mmol/L — ABNORMAL LOW (ref 135–145)

## 2018-03-16 LAB — MAGNESIUM: Magnesium: 1.5 mg/dL — ABNORMAL LOW (ref 1.7–2.4)

## 2018-03-16 LAB — CREATININE, SERUM
CREATININE: 0.6 mg/dL (ref 0.44–1.00)
GFR calc non Af Amer: >60 mL/min (ref >60)

## 2018-03-16 MED ORDER — IBUPROFEN 600 MG PO TABS: 600.0000 mg | ORAL_TABLET | Freq: Four times a day (QID) | ORAL | 0 refills | Status: DC | PRN

## 2018-03-16 NOTE — Progress Notes (Signed)
Explained fluid restriction of 1.25 liters/day to patient. Modeled amount of cups to calculate restriction. Strict  I & O will be monitored. Pt states understanding info.

## 2018-03-16 NOTE — Discharge Summary (Addendum)
OB Discharge Summary     Patient Name: Morgan Richardson DOB: 1986/07/31 MRN: [id number]  Date of admission: 03/15/2018 Delivering MD: Adam Phenix   Date of discharge: 03/16/2018  Admitting diagnosis: RCS Intrauterine pregnancy: [redacted]w[redacted]d     Secondary diagnosis:  Principal Problem:   Status post repeat low transverse cesarean section Active Problems:   Previous cesarean section complicating pregnancy   Language barrier affecting health care   Chronic viral hepatitis B without delta-agent (HCC)   Hyponatremia  Additional problems: none     Discharge diagnosis: Term Pregnancy Delivered                                                                                                Post partum procedures:none  Augmentation: n/a  Complications: None  Hospital course:  Sceduled C/S   32 y.o. yo G3P3003 at [redacted]w[redacted]d was admitted to the hospital 03/15/2018 for scheduled cesarean section with the following indication:Elective Repeat.  Membrane Rupture Time/Date: 12:54 PM ,03/15/2018   Patient delivered a Viable infant.03/15/2018  Details of operation can be found in separate operative note.  Pateint had an uncomplicated postpartum course.  She is ambulating, tolerating a regular diet, passing flatus, and urinating well. Patient is discharged home in stable condition on  03/17/18         Physical exam  Vitals:   03/16/18 1328 03/16/18 1535 03/16/18 1720 03/16/18 1850  BP:  (!) 89/67  (!) 99/57  Pulse:  76  65  Resp:  18  18  Temp:  98.2 F (36.8 C)  98.1 F (36.7 C)  TempSrc:  Oral  Oral  SpO2: 97% 98% 97%   Weight:      Height:       General: alert, cooperative and no distress Lochia: appropriate Uterine Fundus: firm Incision: Healing well with no significant drainage, No significant erythema, Dressing is clean, dry, and intact DVT Evaluation: No evidence of DVT seen on physical exam. Labs: Lab Results  Component Value Date   WBC 9.8 03/16/2018   HGB 10.2 (L) 03/16/2018   HCT  30.6 (L) 03/16/2018   MCV 95.6 03/16/2018   PLT 101 (L) 03/16/2018   CMP Latest Ref Rng & Units 03/16/2018  Glucose 65 - 99 mg/dL 90  BUN 6 - 20 mg/dL 9  Creatinine 4.09 - 8.11 mg/dL 9.14  Sodium 782 - 956 mmol/L 127(L)  Potassium 3.5 - 5.1 mmol/L 3.5  Chloride 101 - 111 mmol/L 96(L)  CO2 22 - 32 mmol/L 24  Calcium 8.9 - 10.3 mg/dL 7.9(L)  Total Protein 6.0 - 8.5 g/dL -  Total Bilirubin 0.0 - 1.2 mg/dL -  Alkaline Phos 39 - 213 IU/L -  AST 0 - 40 IU/L -  ALT 0 - 32 IU/L -    Discharge instruction: per After Visit Summary and "Baby and Me Booklet".  After visit meds:  Allergies as of 03/16/2018   No Known Allergies     Medication List    TAKE these medications   acetaminophen 500 MG tablet Commonly known as:  TYLENOL Take 1,000 mg by mouth  every 6 (six) hours as needed for moderate pain or headache.   ibuprofen 600 MG tablet Commonly known as:  ADVIL,MOTRIN Take 1 tablet (600 mg total) by mouth every 6 (six) hours as needed.   PREPLUS 27-1 MG Tabs Take 1 tablet by mouth daily.   simethicone 125 MG chewable tablet Commonly known as:  MYLICON Chew 125 mg by mouth every 6 (six) hours as needed for flatulence.   tenofovir 300 MG tablet Commonly known as:  VIREAD Take 1 tablet (300 mg total) by mouth daily.       Diet: routine diet  Activity: Advance as tolerated. Pelvic rest for 6 weeks.   Outpatient follow up:2 weeks Follow up Appt: Future Appointments  Date Time Provider Department Center  04/01/2018  9:15 AM [facility] NURSE [facility]  05/03/2018  8:45 AM Comer, Belia Heman, MD [facility]   Follow up Visit:No follow-ups on file.  Postpartum contraception: Nexplanon  Newborn Data: Live born female  Birth Weight: 6 lb 13.2 oz (3095 g) APGAR: 8, 9  Newborn Delivery   Birth date/time:  03/15/2018 12:56:00 Delivery type:  C-Section, Vacuum Assisted Trial of labor:  No C-section categorization:  Repeat     Baby Feeding: Breast Disposition:home with  mother   03/16/2018 Felisa Bonier, MD, PGY-1 Family Medicine - [facility]  CNM attestation I have seen and examined this patient and agree with above documentation in the resident's note.   Morgan Richardson is a 32 y.o. G3P3003 s/p rLTCS.   Pain is well controlled.  Plan for birth control is Nexplanon.  Method of Feeding: breast  PE:  BP 98/64   Pulse 75   Temp 97.6 F (36.4 C) (Oral)   Resp 18   Ht  (1.6 m)   Wt 70.3 kg (155 lb)   LMP 06/15/2017   SpO2 97% Comment: room air  Breastfeeding? Unknown   BMI 27.46 kg/m  Fundus firm  No results for input(s): HGB, HCT in the last 72 hours.   Plan: discharge today - postpartum care discussed - f/u clinic in 2 weeks for incision check, then 4 wks for postpartum visit   SHAW, KIMBERLY, CNM 9:40 AM

## 2018-03-16 NOTE — Progress Notes (Signed)
Paitent was dizzy with ambulation from the bed. Standing patient was a 3-4 dizzy. Walking to sink patient was unsteady. Rn used steady to take patient to bathroom to do peri care. Foley will remain in until patient is more stable.

## 2018-03-16 NOTE — Progress Notes (Signed)
[employer] 180005 interpreter translates maternal and infant care, safety, breast feeding, maternal meds. Pt states understanding information.

## 2018-03-16 NOTE — Progress Notes (Addendum)
Daily Post Partum Note  03/16/2018 Morgan Richardson is a 32 y.o. G3P3003 POD#1s/p scheduled rLTCS (EBL ).  Pregnancy c/b maternal hepB 24hr/overnight events:  Pt with some dizziness earlier today  Subjective:  +pain control, po w/o issue, flatus. No sob, chest pain, fevers, chills. Lochia normal Tried to ambulate earlier today but felt light headed  Objective:   Vitals:   03/16/18 0203 03/16/18 0403 03/16/18 0830 03/16/18 1115  BP:  (!) 80/56 (!) 90/50 (!) 88/58  Pulse:  (!) 59 80 75  Resp:  Temp:  98.5 F (36.9 C) 98.1 F (36.7 C) 98.1 F (36.7 C)  TempSrc:  Oral Oral Axillary  SpO2: 95% 97% 97% 98%  Weight:      Height:        Current Vital Signs 24h Vital Sign Ranges  T 98.1 F (36.7 C) Temp  Avg: 98 F (36.7 C)  Min: 97.5 F (36.4 C)  Max: 98.7 F (37.1 C)  BP (!) 88/58(Dr Monike Bragdon awareofBP&pt is dizzy) BP  Min: 80/56  Max: 101/66  HR 75 Pulse  Avg: 64.9  Min: 56  Max: 80  RR 18 Resp  Avg: 17.7  Min: 11  Max: 21  SaO2 98 % (room air) SpO2  Avg: 97.7 %  Min: 95 %  Max: 100 %       24 Hour I/O Current Shift I/O  Time Ins Outs 05/20 0701 - 05/21 0700 In: 2594 [P.O.:240; I.V.:2354] Out: 1940 [Urine:1300] 05/21 0701 - 05/21 1900 In: 240 [P.O.:240] Out: 800 [Urine:800]    General: NAD Abdomen: +BS, soft, mildly distended, nttp. C/d/i pressure dressing. Firm fundus below the umbilicus Perineum: deferred. Foley in place with normal UOP and approx in bag Skin:  Warm and dry.  Cardiovascular: S1, S2 normal, no murmur, rub or gallop, regular rate and rhythm Respiratory:  Clear to auscultation bilateral. Normal respiratory effort Extremities: no c/c/e  Medications Current Facility-Administered Medications  Medication Dose Route Frequency Provider Last Rate Last Dose  . acetaminophen (TYLENOL) tablet 650 mg  650 mg Oral Q4H PRN Degele, Kandra Nicolas, MD      . coconut oil  1 application Topical PRN Degele, Kandra Nicolas, MD      . Weyman Croon hazel-glycerin (TUCKS)  pad 1 application  1 application Topical PRN Degele, Kandra Nicolas, MD       And  . dibucaine (NUPERCAINAL) 1 % rectal ointment 1 application  1 application Rectal PRN Degele, Kandra Nicolas, MD      . diphenhydrAMINE (BENADRYL) capsule 25 mg  25 mg Oral Q6H PRN Degele, Kandra Nicolas, MD      . diphenhydrAMINE (BENADRYL) injection 12.5 mg  12.5 mg Intravenous Q4H PRN Beryle Lathe, MD      . enoxaparin (LOVENOX) injection 40 mg  40 mg Subcutaneous Q24H Degele, Kandra Nicolas, MD   40 mg at 03/16/18 0831  . ibuprofen (ADVIL,MOTRIN) tablet 600 mg  600 mg Oral Q6H Degele, Kandra Nicolas, MD   600 mg at 03/16/18 0540  . ketorolac (TORADOL) 30 MG/ML injection 30 mg  30 mg Intravenous Q6H PRN Beryle Lathe, MD       Or  . ketorolac (TORADOL) 30 MG/ML injection 30 mg  30 mg Intramuscular Q6H PRN Beryle Lathe, MD      . lactated ringers infusion   Intravenous Continuous Degele, Kandra Nicolas, MD 125 mL/hr at 03/16/18 0411    . menthol-cetylpyridinium (CEPACOL) lozenge 3 mg  1 lozenge  Oral Q2H PRN Degele, Kandra Nicolas, MD      . meperidine (DEMEROL) injection 6.25 mg  6.25 mg Intravenous Q5 min PRN Beryle Lathe, MD      . nalbuphine (NUBAIN) injection 5 mg  5 mg Intravenous Q4H PRN Beryle Lathe, MD       Or  . nalbuphine (NUBAIN) injection 5 mg  5 mg Subcutaneous Q4H PRN Beryle Lathe, MD      . nalbuphine (NUBAIN) injection 5 mg  5 mg Intravenous Once PRN Beryle Lathe, MD       Or  . nalbuphine (NUBAIN) injection 5 mg  5 mg Subcutaneous Once PRN Beryle Lathe, MD      . naloxone [facility]) injection 0.4 mg  0.4 mg Intravenous PRN Beryle Lathe, MD       And  . sodium chloride flush (NS) 0.9 % injection 3 mL  3 mL Intravenous PRN Beryle Lathe, MD      . naloxone HCl [facility]) 2 mg in dextrose 5 % 250 mL infusion  1-4 mcg/kg/hr Intravenous Continuous PRN Beryle Lathe, MD      . ondansetron [facility]) injection 4 mg  4 mg Intravenous Q8H PRN Beryle Lathe, MD      . oxyCODONE (Oxy IR/ROXICODONE) immediate release tablet  10 mg  10 mg Oral Q4H PRN Degele, Kandra Nicolas, MD      . oxyCODONE (Oxy IR/ROXICODONE) immediate release tablet 5 mg  5 mg Oral Q4H PRN Degele, Kandra Nicolas, MD      . prenatal multivitamin tablet 1 tablet  1 tablet Oral Q1200 Degele, Kandra Nicolas, MD      . scopolamine (TRANSDERM-SCOP) 1 MG/3DAYS 1.5 mg  1 patch Transdermal Once Beryle Lathe, MD      . senna-docusate (Senokot-S) tablet 2 tablet  2 tablet Oral Q24H Degele, Kandra Nicolas, MD   2 tablet at 03/15/18 2352  . simethicone (MYLICON) chewable tablet 80 mg  80 mg Oral TID PC Degele, Kandra Nicolas, MD   80 mg at 03/16/18 1610  . Tdap (BOOSTRIX) injection 0.5 mL  0.5 mL Intramuscular Once Degele, Kandra Nicolas, MD      . tenofovir [facility]) tablet 300 mg  300 mg Oral Daily Degele, Kandra Nicolas, MD   300 mg at 03/16/18 1114  . zolpidem (AMBIEN) tablet 5 mg  5 mg Oral QHS PRN Degele, Kandra Nicolas, MD        Labs:  Recent Labs  Lab 03/12/18 1055 03/16/18 0604  WBC 8.1 11.9*  HGB 12.8 10.2*  HCT 39.4 30.3*  PLT 151 114*   Recent Labs  Lab 03/16/18 0604  CREATININE 0.60    Assessment & Plan:  Pt stable *Postpartum/postop: routine care. Recommend d/c foley to encourage ambulation. Pt had negative lying, sitting and standing orthostatics and appropriate cbc. Will get another cbc and will get lytes and ECG. *HepB: followed by ID. Continue with anti-viral *Dispo: likely pod#3  Interpreter used  B POS / Rubella Immune / Varicella Unknown/  RPR negative / HIV negative / HepBsAg positive / Tdap UTD: yes/Flu shot: Yes/ pap neg 2018 / Breast  / Contraception: not d/w pt / Circ: not applicable/ Follow up: WOC  Cornelia Copa MD Attending Center for [facility])

## 2018-03-16 NOTE — Progress Notes (Signed)
Reviewed CBC, Magnesium and CMP results with CNM Clayton Bibles. The EKG results are not back yet. I report the pt tolerated the stedy to the bathroom and walk from the bathroom to bed well. No further orders are received.

## 2018-03-17 LAB — BASIC METABOLIC PANEL
ANION GAP: 7 (ref 5–15)
BUN: 12 mg/dL (ref 6–20)
CALCIUM: 8.2 mg/dL — AB (ref 8.9–10.3)
CHLORIDE: 105 mmol/L (ref 101–111)
CO2: 24 mmol/L (ref 22–32)
Creatinine, Ser: 0.52 mg/dL (ref 0.44–1.00)
GFR calc non Af Amer: >60 mL/min (ref >60)
GLUCOSE: 105 mg/dL — AB (ref 65–99)
POTASSIUM: 3.9 mmol/L (ref 3.5–5.1)
Sodium: 136 mmol/L (ref 135–145)

## 2018-03-17 NOTE — Lactation Note (Signed)
This note was copied from a baby's chart. Lactation Consultation Note  Patient Name: Morgan Richardson [id number] Date: 03/17/2018 Reason for consult: Follow-up assessment Mom's breasts are full this morning.  She states baby is feeding well and she denies questions or concerns.  Reviewed engorgement prevention and treatment.  Manual pump given with instructions on use, cleaning and EBM storage.  Instructed to continue to feed with cues.  Lactation outpatient services and support reviewed and encouraged prn.  Maternal Data    Feeding Feeding Type: Breast Fed Length of feed: 30 min  LATCH Score Latch: Grasps breast easily, tongue down, lips flanged, rhythmical sucking.  Audible Swallowing: A few with stimulation  Type of Nipple: Everted at rest and after stimulation  Comfort (Breast/Nipple): Soft / non-tender  Hold (Positioning): Assistance needed to correctly position infant at breast and maintain latch.  LATCH Score: 8  Interventions    Lactation Tools Discussed/Used     Consult Status Consult Status: Complete Follow-up type: Call as needed    Huston Foley 03/17/2018, 10:36 AM

## 2018-03-17 NOTE — Progress Notes (Signed)
Pt declines translator. Pt states understanding infant and maternal care, safety, breast feeding, reasons to call the MD and follow up appointment info.

## 2018-03-17 NOTE — Progress Notes (Signed)
CSW received and acknowledges consult for EDPS of 9.  Consult screened out due to 9 on EDPS does not warrant a CSW consult.  MOB whom scores are greater than 9/yes to question 10 on Edinburgh Postpartum Depression Screen warrants a CSW consult.   Terrick Allred Boyd-Gilyard, MSW, LCSW Clinical Social Work [phone]  

## 2018-03-19 ENCOUNTER — Encounter (HOSPITAL_COMMUNITY): Payer: Self-pay

## 2018-03-19 ENCOUNTER — Ambulatory Visit (HOSPITAL_COMMUNITY)
Admission: EM | Admit: 2018-03-19 | Discharge: 2018-03-19 | Disposition: A | Discharge status: Home / Self Care | Payer: Medicaid Other | Attending: Nurse Practitioner | Admitting: Nurse Practitioner

## 2018-03-19 DIAGNOSIS — R001 Bradycardia, unspecified: Secondary | ICD-10-CM

## 2018-03-19 LAB — POCT I-STAT, CHEM 8
BUN: 8 mg/dL (ref 6–20)
Calcium, Ion: 1.25 mmol/L (ref 1.15–1.40)
Chloride: 101 mmol/L (ref 101–111)
Creatinine, Ser: 0.5 mg/dL (ref 0.44–1.00)
Glucose, Bld: 83 mg/dL (ref 65–99)
HEMATOCRIT: 34 % — AB (ref 36.0–46.0)
HEMOGLOBIN: 11.6 g/dL — AB (ref 12.0–15.0)
POTASSIUM: 3.9 mmol/L (ref 3.5–5.1)
SODIUM: 137 mmol/L (ref 135–145)
TCO2: 26 mmol/L (ref 22–32)

## 2018-03-19 NOTE — ED Triage Notes (Signed)
Pt presents with complaints of dizziness, translator used. Patient is 4 days post C-Section. Per notes the patient had dizziness while in hospital. Pt endorses diarrhea, fatigue, headache and blurry vision x 3 days.

## 2018-03-19 NOTE — ED Provider Notes (Signed)
[facility]    CSN: [id number] Arrival date & time: 03/19/18  1056     History   Chief Complaint Chief Complaint  Patient presents with  . Dizziness    HPI Morgan Richardson is a 32 y.o. female.   Subjective:  Morgan Richardson is a 32 y.o. G3, P3 female with history of chronic hepatitis B that presents for evaluation of dizziness. Symptoms are exacerbated by rising from supine position and bending over. She also complains of blurred vision and generalized weakness. Symptoms occur intermittently. She denies otalgia, headache, limb paresthesias, unilateral weakness, nausea, vomiting, tinnitus. Notably, the patient was recently discharged from [facility] s/p uncomplicated cesarean section. She was admitted on 03/15/18 and discharged on 03/17/18. Patient reports that dizziness began while in hospital.                Past Medical History:  Diagnosis Date  . Hepatitis B surface antigen positive   . PONV (postoperative nausea and vomiting)     Patient Active Problem List   Diagnosis Date Noted  . Hyponatremia 03/16/2018  . Status post repeat low transverse cesarean section 03/15/2018  . Chronic viral hepatitis B without delta-agent (HCC) 03/03/2018  . Hepatitis B surface antigen positive 10/09/2017  . Previous cesarean section complicating pregnancy 09/29/2017  . Encounter for supervision of normal pregnancy in multigravida, antepartum 09/29/2017  . Language barrier affecting health care 09/29/2017    Past Surgical History:  Procedure Laterality Date  . CESAREAN SECTION    . CESAREAN SECTION N/A 03/15/2018   Procedure: REPEAT CESAREAN SECTION;  Surgeon: Adam Phenix, MD;  Location: [facility] BIRTHING SUITES;  Service: Obstetrics;  Laterality: N/A;    OB History    Gravida  3   Para  3   Term  3   Preterm      AB      Living  3     SAB      TAB      Ectopic      Multiple  0   Live Births  3            Home Medications    Prior to  Admission medications   Medication Sig Start Date End Date Taking? Authorizing Provider  Prenatal Vit-Fe Fumarate-FA (PREPLUS) 27-1 MG TABS Take 1 tablet by mouth daily. 09/29/17  Yes [provider]  tenofovir (VIREAD) 300 MG tablet Take 1 tablet (300 mg total) by mouth daily. 03/03/18  Yes Comer, Belia Heman, MD  acetaminophen (TYLENOL) 500 MG tablet Take 1,000 mg by mouth every 6 (six) hours as needed for moderate pain or headache.     [provider]  ibuprofen (ADVIL,MOTRIN) 600 MG tablet Take 1 tablet (600 mg total) by mouth every 6 (six) hours as needed. 03/16/18   Felisa Bonier, MD  simethicone (MYLICON) 125 MG chewable tablet Chew 125 mg by mouth every 6 (six) hours as needed for flatulence.    [provider]    Family History Family History  Problem Relation Age of Onset  . Hypertension Mother   . Kidney disease Mother     Social History Social History   Tobacco Use  . Smoking status: Never Smoker  . Smokeless tobacco: Never Used  Substance Use Topics  . Alcohol use: No    Frequency: Never  . Drug use: No     Allergies   Patient has no known allergies.   Review of Systems Review of Systems  Constitutional: Negative for fever.  Eyes: Positive for visual disturbance.  Respiratory: Negative for shortness of breath.   Cardiovascular: Negative for chest pain, palpitations and leg swelling.  Gastrointestinal: Negative for nausea and vomiting.  Neurological: Positive for weakness and light-headedness. Negative for syncope, speech difficulty, numbness and headaches.     Physical Exam Triage Vital Signs ED Triage Vitals  Enc Vitals Group     BP 03/19/18 1126 (!) 105/56     Pulse Rate 03/19/18 1126 (!) 55     Resp 03/19/18 1126 18     Temp 03/19/18 1126 98 F (36.7 C)     Temp src --      SpO2 03/19/18 1126 100 %     Weight --      Height --      Head Circumference --      Peak Flow --      Pain Score 03/19/18 1129 0     Pain Loc --        Pain Edu? --      Excl. in GC? --    No data found.  Updated Vital Signs BP (!) 105/56   Pulse (!) 55   Temp 98 F (36.7 C)   Resp 18   LMP 06/15/2017   SpO2 100%   Visual Acuity Right Eye Distance:   Left Eye Distance:   Bilateral Distance:    Right Eye Near:   Left Eye Near:    Bilateral Near:     Physical Exam  Constitutional: She is oriented to person, place, and time. She appears well-developed and well-nourished.  Neck: Normal range of motion.  Cardiovascular: Normal rate, regular rhythm, normal heart sounds and intact distal pulses.  Pulmonary/Chest: Effort normal and breath sounds normal.  Musculoskeletal: Normal range of motion.  Neurological: She is alert and oriented to person, place, and time. She has normal strength. No cranial nerve deficit or sensory deficit. Coordination and gait normal. GCS eye subscore is 4. GCS verbal subscore is 5. GCS motor subscore is 6.  Negative Dix-Hallpike   Skin: Skin is warm and dry.  Psychiatric: She has a normal mood and affect.     UC Treatments / Results  Labs (all labs ordered are listed, but only abnormal results are displayed) Labs Reviewed  POCT I-STAT, CHEM 8 - Abnormal; Notable for the following components:      Result Value   Hemoglobin 11.6 (*)    HCT 34.0 (*)    All other components within normal limits    EKG None  Radiology No results found.  Procedures Procedures (including critical care time)  Medications Ordered in UC Medications - No data to display  Initial Impression / Assessment and Plan / UC Course  I have reviewed the triage vital signs and the nursing notes.  Pertinent labs & imaging results that were available during my care of the patient were reviewed by me and considered in my medical decision making (see chart for details).    32 year old G3, P3 female with history of chronic hepatitis B who is two-days postpartum presenting with dizziness, generalized weakness and blurred  vision. In review of her most recent hospitalization, it was noted that the patient had dizziness with unsteady gait on 03/16/2018. Her blood pressure was a bit on the low side at that time but she had negative orthostatics. CBC, CMP and EKG were performed at that time as well. EKG was shown to be normal sinus rhythm,  hemoglobin stable around  10 g/dL and sodium noted to be 127 mmol/L.   Physical exam today reveals no focal neuro deficits. BP and orthostatic vital signs unremarkable. I-Stat shows improved sodium level of 137 mmol/L and hemoglobin stable at 11.6 g/dL. EKG shows sinus bradycardia with a rate of 51, otherwise normal.   Plan:  - close outpatient monitoring with strict return precautions. Follow-up Dr. Staci Righter in 3 days. Call today to make appointment   Evaluation has revealed no signs of a dangerous process. Patient aware of findings.  Discussed diagnosis, treatment plan, possible red flag symptoms to watch out for, and the need for close follow up. Patient understands verbal as well as written discharge instructions, is warned regarding their illness and prognosis, is able to repeat any risks, and is comfortable with plan and disposition. Has a clear mental status at this time, good insight into illness (after discussion and teaching) and has clear judgment to make decisions regarding their care.  Documentation was completed with the aid of voice recognition software. Transcription may contain typographical errors.    Final Clinical Impressions(s) / UC Diagnoses   Final diagnoses:  Sinus bradycardia   Discharge Instructions   None    ED Prescriptions    None     Controlled Substance Prescriptions [place] Controlled Substance Registry consulted? Not Applicable   Lurline Idol, FNP 03/19/18 1230

## 2018-04-01 ENCOUNTER — Other Ambulatory Visit: Payer: Self-pay

## 2018-04-01 ENCOUNTER — Ambulatory Visit (INDEPENDENT_AMBULATORY_CARE_PROVIDER_SITE_OTHER): Payer: Medicaid Other

## 2018-04-01 VITALS — BP 101/65 | HR 64 | Wt 138.0 lb

## 2018-04-01 DIAGNOSIS — Z98891 History of uterine scar from previous surgery: Secondary | ICD-10-CM

## 2018-04-01 NOTE — Patient Instructions (Signed)

## 2018-04-01 NOTE — Progress Notes (Signed)
Patient here today for incision check.  C-section May 20.  Healing is remarkable.  Patient does still take ibuprofen as needed for discomfort.

## 2018-04-30 ENCOUNTER — Ambulatory Visit (INDEPENDENT_AMBULATORY_CARE_PROVIDER_SITE_OTHER): Payer: Medicaid Other | Admitting: Obstetrics & Gynecology

## 2018-04-30 ENCOUNTER — Encounter: Payer: Self-pay | Admitting: Obstetrics & Gynecology

## 2018-04-30 DIAGNOSIS — Z1389 Encounter for screening for other disorder: Secondary | ICD-10-CM

## 2018-04-30 DIAGNOSIS — Z98891 History of uterine scar from previous surgery: Secondary | ICD-10-CM

## 2018-04-30 DIAGNOSIS — Z30430 Encounter for insertion of intrauterine contraceptive device: Secondary | ICD-10-CM | POA: Diagnosis not present

## 2018-04-30 DIAGNOSIS — Z789 Other specified health status: Secondary | ICD-10-CM

## 2018-04-30 LAB — POCT PREGNANCY, URINE: PREG TEST UR: NEGATIVE

## 2018-04-30 MED ORDER — IBUPROFEN 600 MG PO TABS: 600.0000 mg | ORAL_TABLET | Freq: Four times a day (QID) | ORAL | 0 refills | Status: DC | PRN

## 2018-04-30 MED ORDER — LEVONORGESTREL 19.5 MCG/DAY IU IUD
INTRAUTERINE_SYSTEM | Freq: Once | INTRAUTERINE | Status: AC
  Administered 2018-04-30: 1 via INTRAUTERINE

## 2018-04-30 NOTE — Progress Notes (Signed)
Subjective:     Morgan Richardson is a 32 y.o. female who presents for a postpartum visit. She is 6 weeks postpartum following a low cervical transverse Cesarean section. I have fully reviewed the prenatal and intrapartum course. The delivery was at 39 gestational weeks. Outcome: repeat cesarean section, low transverse incision. Anesthesia: spinal. Postpartum course has been normal. Baby's course has been good. Baby is feeding by breast. Bleeding no bleeding. Bowel function is normal. Bladder function is normal. Patient is not sexually active. Contraception method is none. Postpartum depression screening: negative.  The following portions of the patient's history were reviewed and updated as appropriate: allergies, current medications, past family history, past medical history, past social history, past surgical history and problem list.  Review of Systems Pertinent items are noted in HPI.   Objective:    LMP 06/15/2017   General:  alert, cooperative and no distress           Abdomen: incision intact and healing normally   Vulva:  normal  Vagina: normal vagina  Cervix:  no lesions  Corpus: normal  Adnexa:  not evaluated  Rectal Exam: Not performed.          GYNECOLOGY OFFICE PROCEDURE NOTE  Morgan SoLydia Van Kernen is a 32 y.o. [phone] here for Liletta IUD insertion. No GYN concerns.  Last pap smear was on 09/2017 and was normal.  IUD Insertion Procedure Note Patient identified, informed consent performed, consent signed.   Discussed risks of irregular bleeding, cramping, infection, malpositioning or misplacement of the IUD outside the uterus which may require further procedure such as laparoscopy. Time out was performed.  Urine pregnancy test negative.  Speculum placed in the vagina.  Cervix visualized.  Cleaned with cleanser x 2.  Grasped anteriorly with a single tooth tenaculum.  Uterus sounded to 8 cm.  Liletta IUD placed per manufacturer's recommendations.  Strings trimmed to 3 cm. Tenaculum  was removed, good hemostasis noted.  Patient tolerated procedure well.   Patient was given post-procedure instructions.  She was advised to have backup contraception for one week.  Patient was also asked to check IUD strings periodically and follow up in 4 weeks for IUD check.    Assessment:     normal postpartum exam. Pap smear not done at today's visit.   Plan:    1. Contraception: IUD 2. Has abstained from intercourse 3. Follow up in: 4 weeks or as needed.    Adam PhenixArnold, Adamari Frede G, MD 04/30/2018

## 2018-04-30 NOTE — Addendum Note (Signed)
Addended by: Kathee DeltonHILLMAN, Shakiara Lukic L on: 04/30/2018 01:42 PM   Modules accepted: Orders

## 2018-04-30 NOTE — Patient Instructions (Signed)

## 2018-05-03 ENCOUNTER — Encounter: Payer: Self-pay | Admitting: Internal Medicine

## 2018-05-03 ENCOUNTER — Ambulatory Visit (INDEPENDENT_AMBULATORY_CARE_PROVIDER_SITE_OTHER): Payer: Medicaid Other | Admitting: Internal Medicine

## 2018-05-03 ENCOUNTER — Encounter: Payer: Self-pay | Admitting: *Deleted

## 2018-05-03 VITALS — BP 102/69 | HR 87 | Temp 98.4°F | Ht 63.0 in | Wt 139.0 lb

## 2018-05-03 DIAGNOSIS — Z789 Other specified health status: Secondary | ICD-10-CM | POA: Diagnosis not present

## 2018-05-03 DIAGNOSIS — B181 Chronic viral hepatitis B without delta-agent: Secondary | ICD-10-CM | POA: Diagnosis not present

## 2018-05-03 MED ORDER — TENOFOVIR DISOPROXIL FUMARATE 300 MG PO TABS: 300.0000 mg | ORAL_TABLET | Freq: Every day | ORAL | 2 refills | Status: DC

## 2018-05-03 NOTE — Assessment & Plan Note (Addendum)
I will recheck her labs today and do an elastography now post partum to further determine if treatment is indicated.  I will check hepatitis A and C

## 2018-05-03 NOTE — Assessment & Plan Note (Signed)
Interpretor used and pt voiced her understanding.

## 2018-05-03 NOTE — Assessment & Plan Note (Signed)
I will have her continue tenofovir during breastfeeding.  Refills sent

## 2018-05-03 NOTE — Progress Notes (Signed)
   Subjective:    Patient ID: Morgan Richardson, female    DOB: 05/04/1986, 32 y.o.   MRN: [id number]  HPI Here for follow up of chronic hepatitis B. She is now post delivery and has been on tenofovir for peripartum chronic hepatitis B.  She has been off since her delivery. Was a C section.  Her viral load was 183 million but normal LFTs (22/23).  No new issues.  No associated rash.     Review of Systems  Constitutional: Negative for fatigue.  Skin: Negative for rash.       Objective:   Physical Exam  Constitutional: She appears well-developed and well-nourished. No distress.  Eyes: No scleral icterus.  Cardiovascular: Normal rate, regular rhythm and normal heart sounds.  No murmur heard. Pulmonary/Chest: Effort normal and breath sounds normal. No respiratory distress.   SH: s/p birth of her third child       Assessment & Plan:

## 2018-05-05 LAB — COMPLETE METABOLIC PANEL WITH GFR
AG RATIO: 1.4 (calc) (ref 1.0–2.5)
ALBUMIN MSPROF: 4 g/dL (ref 3.6–5.1)
ALT: 40 U/L — ABNORMAL HIGH (ref 6–29)
AST: 28 U/L (ref 10–30)
Alkaline phosphatase (APISO): 125 U/L — ABNORMAL HIGH (ref 33–115)
BUN: 10 mg/dL (ref 7–25)
CALCIUM: 9.5 mg/dL (ref 8.6–10.2)
CO2: 30 mmol/L (ref 20–32)
CREATININE: 0.55 mg/dL (ref 0.50–1.10)
Chloride: 103 mmol/L (ref 98–110)
GFR, EST AFRICAN AMERICAN: 142 mL/min/{1.73_m2} (ref >=60)
GFR, EST NON AFRICAN AMERICAN: 122 mL/min/{1.73_m2} (ref >=60)
GLOBULIN: 2.9 g/dL (ref 1.9–3.7)
Glucose, Bld: 109 mg/dL — ABNORMAL HIGH (ref 65–99)
POTASSIUM: 4.6 mmol/L (ref 3.5–5.3)
SODIUM: 140 mmol/L (ref 135–146)
Total Bilirubin: 0.6 mg/dL (ref 0.2–1.2)
Total Protein: 6.9 g/dL (ref 6.1–8.1)

## 2018-05-05 LAB — HEPATITIS C ANTIBODY
HEP C AB: NONREACTIVE
SIGNAL TO CUT-OFF: 0.03 (ref <1.00)

## 2018-05-05 LAB — HEPATITIS B SURFACE ANTIGEN: Hepatitis B Surface Ag: REACTIVE — AB

## 2018-05-05 LAB — HEPATITIS B DNA, ULTRAQUANTITATIVE, PCR
HEPATITIS B DNA (CALC): 8.76 {Log_IU}/mL — AB
HEPATITIS B DNA: 576000000 [IU]/mL — AB

## 2018-05-05 LAB — HEPATITIS A ANTIBODY, TOTAL: HEPATITIS A AB,TOTAL: REACTIVE — AB

## 2018-05-05 LAB — HEPATITIS B E ANTIGEN: Hep B E Ag: REACTIVE — AB

## 2018-05-10 ENCOUNTER — Ambulatory Visit (HOSPITAL_COMMUNITY)
Admission: RE | Admit: 2018-05-10 | Discharge: 2018-05-10 | Disposition: A | Discharge status: Home / Self Care | Payer: Medicaid Other | Source: Ambulatory Visit | Attending: Internal Medicine | Admitting: Internal Medicine

## 2018-05-10 DIAGNOSIS — B181 Chronic viral hepatitis B without delta-agent: Secondary | ICD-10-CM | POA: Insufficient documentation

## 2018-05-28 ENCOUNTER — Ambulatory Visit: Payer: Medicaid Other | Admitting: Internal Medicine

## 2018-06-03 ENCOUNTER — Ambulatory Visit: Payer: Medicaid Other | Admitting: Obstetrics & Gynecology

## 2018-06-21 ENCOUNTER — Encounter: Payer: Self-pay | Admitting: Obstetrics & Gynecology

## 2018-06-21 ENCOUNTER — Ambulatory Visit (INDEPENDENT_AMBULATORY_CARE_PROVIDER_SITE_OTHER): Payer: Medicaid Other | Admitting: Obstetrics & Gynecology

## 2018-06-21 VITALS — BP 101/68 | HR 72 | Wt 135.3 lb

## 2018-06-21 DIAGNOSIS — Z30431 Encounter for routine checking of intrauterine contraceptive device: Secondary | ICD-10-CM | POA: Diagnosis not present

## 2018-06-21 NOTE — Progress Notes (Signed)
  Subjective:     Patient ID: Morgan Richardson, female   DOB: 12/11/1985, 32 y.o.   MRN: [id number] CC: f/u after IUD 04/2018 HPI G3P3003 No LMP recorded. She had Liletta placed 6 weeks ago. No pain, discharge or abnormal bleeding Past Medical History:  Diagnosis Date  . Hepatitis B surface antigen positive   . PONV (postoperative nausea and vomiting)    Past Surgical History:  Procedure Laterality Date  . CESAREAN SECTION    . CESAREAN SECTION N/A 03/15/2018   Procedure: REPEAT CESAREAN SECTION;  Surgeon: Adam PhenixArnold, James G, MD;  [facility];  Service: Obstetrics;  Laterality: N/A;   No Known Allergies    Review of Systems  Constitutional: Negative.   Gastrointestinal: Negative.   Genitourinary: Negative for pelvic pain, vaginal bleeding and vaginal discharge.       Objective:   Physical Exam  Constitutional: She appears well-developed. No distress.  Pulmonary/Chest: Effort normal.  Genitourinary: Vagina normal and uterus normal. No vaginal discharge found.  Genitourinary Comments: Cervix normal and string 3 cm at os  Vitals reviewed. Blood pressure 101/68, pulse 72, weight 135 lb 4.8 oz (61.4 kg), currently breastfeeding.      Assessment:     Normal IUD placement     Plan:     Routine gyn follow-up  Adam PhenixArnold, James G, MD 06/21/2018

## 2018-06-21 NOTE — Patient Instructions (Signed)

## 2018-06-22 ENCOUNTER — Ambulatory Visit (INDEPENDENT_AMBULATORY_CARE_PROVIDER_SITE_OTHER): Payer: Medicaid Other | Admitting: Internal Medicine

## 2018-06-22 ENCOUNTER — Ambulatory Visit: Payer: Medicaid Other | Admitting: Internal Medicine

## 2018-06-22 ENCOUNTER — Encounter: Payer: Self-pay | Admitting: Internal Medicine

## 2018-06-22 VITALS — BP 95/54 | HR 64 | Temp 98.7°F | Ht 63.0 in | Wt 129.0 lb

## 2018-06-22 DIAGNOSIS — B181 Chronic viral hepatitis B without delta-agent: Secondary | ICD-10-CM | POA: Diagnosis not present

## 2018-06-22 DIAGNOSIS — K7400 Hepatic fibrosis, unspecified: Secondary | ICD-10-CM

## 2018-06-22 DIAGNOSIS — Z789 Other specified health status: Secondary | ICD-10-CM | POA: Diagnosis not present

## 2018-06-22 MED ORDER — TENOFOVIR DISOPROXIL FUMARATE 300 MG PO TABS: 300.0000 mg | ORAL_TABLET | Freq: Every day | ORAL | 11 refills | Status: DC

## 2018-06-22 NOTE — Assessment & Plan Note (Signed)
Visit with video interpretor.  She was given time to ask questions and verify understanding.

## 2018-06-22 NOTE — Assessment & Plan Note (Addendum)
She is on tenofovir and I discussed her staying on this indefinitely due to high viral load and associated transaminitis > 2x upperlimit of normal for a female.  I will have her return in 2 months with labs before for monitoring then every 6 months.   HCC screening at 32 years of age

## 2018-06-22 NOTE — Progress Notes (Signed)
   Subjective:    Patient ID: Morgan Richardson, female    DOB: 10/26/1986, 32 y.o.   MRN: [id number]  HPI Here for follow up of chronic hepatitis B.  Noted during pregnancy and I put her on tenofovir during pregnancy.  She stopped afterwards and I repeated the labs and her viral DNA is 576 million, AST 28, ALT 40.  She is E Ag positive.  Elastography with F2/3.  Not planning on having any more children.  Not aware of anyone in her family with hepatitis B or HCC.     Review of Systems  Constitutional: Negative for fatigue.  Gastrointestinal: Negative for diarrhea and nausea.       Objective:   Physical Exam  Constitutional: She appears well-developed and well-nourished. No distress.  HENT:  Mouth/Throat: No oropharyngeal exudate.  Eyes: No scleral icterus.  Cardiovascular: Normal rate, regular rhythm and normal heart sounds.  No murmur heard. Pulmonary/Chest: Effort normal and breath sounds normal. No respiratory distress.  Skin: No rash noted.   SH: no alcohol       Assessment & Plan:

## 2018-06-22 NOTE — Assessment & Plan Note (Signed)
Some fibrosis on elastography, no cirrhosis.

## 2018-08-17 ENCOUNTER — Other Ambulatory Visit: Payer: Medicaid Other

## 2018-08-17 DIAGNOSIS — B181 Chronic viral hepatitis B without delta-agent: Secondary | ICD-10-CM | POA: Diagnosis not present

## 2018-08-19 LAB — HEPATITIS B DNA, ULTRAQUANTITATIVE, PCR
HEPATITIS B DNA (CALC): 3.54 {Log_IU}/mL — AB
HEPATITIS B DNA: 3480 [IU]/mL — AB

## 2018-08-19 LAB — COMPLETE METABOLIC PANEL WITH GFR
AG Ratio: 1.4 (calc) (ref 1.0–2.5)
ALKALINE PHOSPHATASE (APISO): 147 U/L — AB (ref 33–115)
ALT: 41 U/L — ABNORMAL HIGH (ref 6–29)
AST: 30 U/L (ref 10–30)
Albumin: 4.2 g/dL (ref 3.6–5.1)
BUN: 10 mg/dL (ref 7–25)
CO2: 31 mmol/L (ref 20–32)
CREATININE: 0.56 mg/dL (ref 0.50–1.10)
Calcium: 9.7 mg/dL (ref 8.6–10.2)
Chloride: 104 mmol/L (ref 98–110)
GFR, Est African American: 141 mL/min/{1.73_m2} (ref >=60)
GFR, Est Non African American: 122 mL/min/{1.73_m2} (ref >=60)
GLOBULIN: 2.9 g/dL (ref 1.9–3.7)
Glucose, Bld: 95 mg/dL (ref 65–99)
POTASSIUM: 4.6 mmol/L (ref 3.5–5.3)
SODIUM: 140 mmol/L (ref 135–146)
Total Bilirubin: 0.3 mg/dL (ref 0.2–1.2)
Total Protein: 7.1 g/dL (ref 6.1–8.1)

## 2018-09-13 ENCOUNTER — Ambulatory Visit (INDEPENDENT_AMBULATORY_CARE_PROVIDER_SITE_OTHER): Payer: Medicaid Other | Admitting: Internal Medicine

## 2018-09-13 ENCOUNTER — Encounter: Payer: Self-pay | Admitting: Internal Medicine

## 2018-09-13 VITALS — BP 101/69 | HR 69 | Ht 60.0 in | Wt 141.0 lb

## 2018-09-13 DIAGNOSIS — K7400 Hepatic fibrosis, unspecified: Secondary | ICD-10-CM

## 2018-09-13 DIAGNOSIS — B181 Chronic viral hepatitis B without delta-agent: Secondary | ICD-10-CM

## 2018-09-13 DIAGNOSIS — Z23 Encounter for immunization: Secondary | ICD-10-CM

## 2018-09-13 NOTE — Assessment & Plan Note (Signed)
I will repeat the elastography in about 1 year.

## 2018-09-13 NOTE — Progress Notes (Signed)
   Subjective:    Patient ID: Morgan Richardson, female    DOB: 12/10/1985, 32 y.o.   MRN: [id number]  HPI Here for follow up of chronic hepatitis B.  Noted during pregnancy and I put her on tenofovir during pregnancy.  She stopped afterwards and I repeated the labs and her viral DNA is 576 million, AST 28, ALT 40.  She is E Ag positive.  Elastography with F2/3.  Not planning on having any more children.   She started back on Viread and taking daily with no missed doses.  She feels it is 'strong' but ok to continue taking it.  Viral DNA down to  3,490 copies from 570 million.  LFTs about the same.  Breastfeeding and baby has been getting vaccines.     Review of Systems  Constitutional: Negative for fatigue.  Gastrointestinal: Negative for diarrhea and nausea.       Objective:   Physical Exam  Constitutional: She appears well-developed and well-nourished. No distress.  HENT:  Mouth/Throat: No oropharyngeal exudate.  Eyes: No scleral icterus.  Cardiovascular: Normal rate, regular rhythm and normal heart sounds.  No murmur heard. Pulmonary/Chest: Effort normal and breath sounds normal. No respiratory distress.  Skin: No rash noted.   SH: no alcohol       Assessment & Plan:

## 2018-09-13 NOTE — Assessment & Plan Note (Signed)
Much improved and will continue with Viread.  She did ask about taking a 'break' during breastfeeding but encouraged to continue on Viread.  rtc 6 months with labs before the visit

## 2019-03-15 ENCOUNTER — Encounter: Payer: Self-pay | Admitting: Internal Medicine

## 2019-03-15 ENCOUNTER — Ambulatory Visit: Payer: Medicaid Other | Admitting: Internal Medicine

## 2019-03-15 ENCOUNTER — Other Ambulatory Visit: Payer: Self-pay

## 2019-03-15 VITALS — BP 105/70 | HR 76 | Temp 98.0°F | Wt 145.0 lb

## 2019-03-15 DIAGNOSIS — B181 Chronic viral hepatitis B without delta-agent: Secondary | ICD-10-CM | POA: Diagnosis not present

## 2019-03-15 DIAGNOSIS — Z5181 Encounter for therapeutic drug level monitoring: Secondary | ICD-10-CM | POA: Diagnosis not present

## 2019-03-15 NOTE — Progress Notes (Signed)
   Subjective:    Patient ID: Morgan Richardson, female    DOB: 01-27-1986, 33 y.o.   MRN: [id number]  HPI Here for follow up of chronic hepatitis B.  Noted during pregnancy and I put her on tenofovir during pregnancy.  She stopped afterwards and I repeated the labs and her viral DNA is 576 million, AST 28, ALT 40.  She is E Ag positive.  Elastography with F2/3.  Not planning on having any more children.   She started back on Viread prior to last visit and taking daily with no missed doses. Viral DNA came down to 3,480 copies from 570 million.    Review of Systems  Constitutional: Negative for fatigue.  Gastrointestinal: Negative for diarrhea and nausea.       Objective:   Physical Exam Constitutional:      General: She is not in acute distress.    Appearance: She is well-developed.  Eyes:     General: No scleral icterus. Cardiovascular:     Rate and Rhythm: Normal rate and regular rhythm.     Heart sounds: Normal heart sounds. No murmur.  Pulmonary:     Effort: Pulmonary effort is normal. No respiratory distress.     Breath sounds: Normal breath sounds.  Skin:    Findings: No rash.    SH: no alcohol       Assessment & Plan:

## 2019-03-15 NOTE — Assessment & Plan Note (Signed)
Will check LFTs and creat on Viread.

## 2019-03-15 NOTE — Assessment & Plan Note (Signed)
Doing well on Viread.  Treatment indication with liver fibrosis and elevation of LFTs.  Continue with the same. rtc 6 months.

## 2019-03-25 LAB — COMPLETE METABOLIC PANEL WITH GFR
AG Ratio: 1.5 (calc) (ref 1.0–2.5)
ALT: 49 U/L — ABNORMAL HIGH (ref 6–29)
AST: 32 U/L — ABNORMAL HIGH (ref 10–30)
Albumin: 4.1 g/dL (ref 3.6–5.1)
Alkaline phosphatase (APISO): 141 U/L — ABNORMAL HIGH (ref 31–125)
BUN: 12 mg/dL (ref 7–25)
CO2: 28 mmol/L (ref 20–32)
Calcium: 9.3 mg/dL (ref 8.6–10.2)
Chloride: 105 mmol/L (ref 98–110)
Creat: 0.61 mg/dL (ref 0.50–1.10)
GFR, Est African American: 136 mL/min/{1.73_m2} (ref >=60)
GFR, Est Non African American: 117 mL/min/{1.73_m2} (ref >=60)
Globulin: 2.8 g/dL (calc) (ref 1.9–3.7)
Glucose, Bld: 100 mg/dL — ABNORMAL HIGH (ref 65–99)
Potassium: 4.2 mmol/L (ref 3.5–5.3)
Sodium: 141 mmol/L (ref 135–146)
Total Bilirubin: 0.5 mg/dL (ref 0.2–1.2)
Total Protein: 6.9 g/dL (ref 6.1–8.1)

## 2019-03-25 LAB — HEPATITIS B DNA, ULTRAQUANTITATIVE, PCR
Hepatitis B DNA (Calc): 3.11 Log IU/mL — ABNORMAL HIGH
Hepatitis B DNA: 1280 IU/mL — ABNORMAL HIGH

## 2019-07-06 ENCOUNTER — Other Ambulatory Visit: Payer: Self-pay | Admitting: Internal Medicine

## 2019-09-15 ENCOUNTER — Ambulatory Visit (INDEPENDENT_AMBULATORY_CARE_PROVIDER_SITE_OTHER): Payer: Medicaid Other | Admitting: Internal Medicine

## 2019-09-15 ENCOUNTER — Other Ambulatory Visit: Payer: Self-pay

## 2019-09-15 ENCOUNTER — Encounter: Payer: Self-pay | Admitting: Internal Medicine

## 2019-09-15 VITALS — Wt 160.0 lb

## 2019-09-15 DIAGNOSIS — K7400 Hepatic fibrosis, unspecified: Secondary | ICD-10-CM | POA: Diagnosis not present

## 2019-09-15 DIAGNOSIS — B181 Chronic viral hepatitis B without delta-agent: Secondary | ICD-10-CM

## 2019-09-15 DIAGNOSIS — Z5181 Encounter for therapeutic drug level monitoring: Secondary | ICD-10-CM

## 2019-09-15 NOTE — Assessment & Plan Note (Signed)
Will check her labs again today Continue viread rtc 6 months.

## 2019-09-15 NOTE — Assessment & Plan Note (Signed)
Will check her creat on Viread

## 2019-09-15 NOTE — Progress Notes (Signed)
   Subjective:    Patient ID: Morgan Richardson, female    DOB: 22-Dec-1985, 33 y.o.   MRN: [id number]  HPI Here for follow up of chronic hepatitis B.  She continues on Viread and denies any missed doses   Last viral load just 1280 from a baseline of 576,000,000.  Feels well with no significant side effects. No associated rash.  Asking about liver health.   Review of Systems  Constitutional: Negative for fatigue and unexpected weight change.  Gastrointestinal: Negative for diarrhea and nausea.  Skin: Negative for rash.       Objective:   Physical Exam Constitutional:      Appearance: Normal appearance.  Eyes:     General: No scleral icterus. Skin:    Findings: No rash.  Neurological:     Mental Status: She is alert.  Psychiatric:        Mood and Affect: Mood normal.   SH: no alcohol        Assessment & Plan:

## 2019-09-15 NOTE — Assessment & Plan Note (Signed)
With her previous fibrosis, will recheck her ultrasound

## 2019-09-20 ENCOUNTER — Ambulatory Visit
Admission: RE | Admit: 2019-09-20 | Discharge: 2019-09-20 | Disposition: A | Discharge status: Home / Self Care | Payer: Medicaid Other | Source: Ambulatory Visit | Attending: Internal Medicine | Admitting: Internal Medicine

## 2019-09-20 DIAGNOSIS — B181 Chronic viral hepatitis B without delta-agent: Secondary | ICD-10-CM

## 2019-09-20 DIAGNOSIS — K7689 Other specified diseases of liver: Secondary | ICD-10-CM | POA: Diagnosis not present

## 2019-09-23 LAB — COMPLETE METABOLIC PANEL WITH GFR
AG Ratio: 1.4 (calc) (ref 1.0–2.5)
ALT: 55 U/L — ABNORMAL HIGH (ref 6–29)
AST: 31 U/L — ABNORMAL HIGH (ref 10–30)
Albumin: 4.2 g/dL (ref 3.6–5.1)
Alkaline phosphatase (APISO): 132 U/L — ABNORMAL HIGH (ref 31–125)
BUN: 12 mg/dL (ref 7–25)
CO2: 29 mmol/L (ref 20–32)
Calcium: 9.7 mg/dL (ref 8.6–10.2)
Chloride: 103 mmol/L (ref 98–110)
Creat: 0.63 mg/dL (ref 0.50–1.10)
GFR, Est African American: 135 mL/min/{1.73_m2} (ref >=60)
GFR, Est Non African American: 116 mL/min/{1.73_m2} (ref >=60)
Globulin: 2.9 g/dL (calc) (ref 1.9–3.7)
Glucose, Bld: 119 mg/dL — ABNORMAL HIGH (ref 65–99)
Potassium: 4.8 mmol/L (ref 3.5–5.3)
Sodium: 141 mmol/L (ref 135–146)
Total Bilirubin: 0.5 mg/dL (ref 0.2–1.2)
Total Protein: 7.1 g/dL (ref 6.1–8.1)

## 2019-09-23 LAB — HEPATITIS B DNA, ULTRAQUANTITATIVE, PCR
Hepatitis B DNA (Calc): 3.07 Log IU/mL — ABNORMAL HIGH
Hepatitis B DNA: 1180 IU/mL — ABNORMAL HIGH

## 2019-09-23 LAB — HEPATITIS DELTA ANTIBODY: Hepatitis D Ab, Total: NEGATIVE

## 2020-01-17 ENCOUNTER — Other Ambulatory Visit: Payer: Self-pay | Admitting: Internal Medicine

## 2020-02-07 DIAGNOSIS — K011 Impacted teeth: Secondary | ICD-10-CM | POA: Diagnosis not present

## 2020-02-21 ENCOUNTER — Telehealth: Payer: Self-pay | Admitting: *Deleted

## 2020-02-21 NOTE — Telephone Encounter (Signed)
Called [facility] ( Dr. Lorin Picket, [facility] Mer[facility] --Oral surgery) faxed--[phone].

## 2020-03-14 ENCOUNTER — Ambulatory Visit (INDEPENDENT_AMBULATORY_CARE_PROVIDER_SITE_OTHER): Payer: Medicaid Other | Admitting: Internal Medicine

## 2020-03-14 ENCOUNTER — Encounter: Payer: Self-pay | Admitting: Internal Medicine

## 2020-03-14 ENCOUNTER — Other Ambulatory Visit: Payer: Self-pay

## 2020-03-14 VITALS — BP 108/72 | HR 76 | Wt 163.0 lb

## 2020-03-14 DIAGNOSIS — B181 Chronic viral hepatitis B without delta-agent: Secondary | ICD-10-CM | POA: Diagnosis not present

## 2020-03-14 DIAGNOSIS — Z789 Other specified health status: Secondary | ICD-10-CM | POA: Diagnosis not present

## 2020-03-14 DIAGNOSIS — Z9189 Other specified personal risk factors, not elsewhere classified: Secondary | ICD-10-CM | POA: Diagnosis not present

## 2020-03-14 NOTE — Assessment & Plan Note (Signed)
She is doing well on Viread and no missed doses or concerns.  She will continue, check labs today and rtc 6 months.

## 2020-03-14 NOTE — Progress Notes (Signed)
   Subjective:    Patient ID: Morgan Richardson, female    DOB: [birth year], 34 y.o.   MRN: [id number]  HPI Here for follow up of chronic hepatitis B.  She continues on Viread and denies any missed doses   Last viral load just 1180 from a baseline of 576,000,000.  Feels well with no significant side effects. No associated rash.    Review of Systems  Constitutional: Negative for fatigue and unexpected weight change.  Gastrointestinal: Negative for diarrhea and nausea.  Skin: Negative for rash.       Objective:   Physical Exam Constitutional:      Appearance: Normal appearance.  Eyes:     General: No scleral icterus. Skin:    Findings: No rash.  Neurological:     Mental Status: She is alert.  Psychiatric:        Mood and Affect: Mood normal.   SH: no alcohol        Assessment & Plan:

## 2020-03-14 NOTE — Assessment & Plan Note (Signed)
Will continue with HCC screening every 6 months due to active chronic hepatitis C (high viral load initially and liver inflammation)

## 2020-03-14 NOTE — Assessment & Plan Note (Signed)
Used a Nurse, learning disability

## 2020-03-16 LAB — COMPLETE METABOLIC PANEL WITH GFR
AG Ratio: 1.3 (calc) (ref 1.0–2.5)
ALT: 50 U/L — ABNORMAL HIGH (ref 6–29)
AST: 31 U/L — ABNORMAL HIGH (ref 10–30)
Albumin: 4 g/dL (ref 3.6–5.1)
Alkaline phosphatase (APISO): 111 U/L (ref 31–125)
BUN: 12 mg/dL (ref 7–25)
CO2: 30 mmol/L (ref 20–32)
Calcium: 9.6 mg/dL (ref 8.6–10.2)
Chloride: 107 mmol/L (ref 98–110)
Creat: 0.74 mg/dL (ref 0.50–1.10)
GFR, Est African American: 121 mL/min/{1.73_m2} (ref >=60)
GFR, Est Non African American: 104 mL/min/{1.73_m2} (ref >=60)
Globulin: 3 g/dL (calc) (ref 1.9–3.7)
Glucose, Bld: 98 mg/dL (ref 65–99)
Potassium: 4.9 mmol/L (ref 3.5–5.3)
Sodium: 141 mmol/L (ref 135–146)
Total Bilirubin: 0.5 mg/dL (ref 0.2–1.2)
Total Protein: 7 g/dL (ref 6.1–8.1)

## 2020-03-16 LAB — HEPATITIS B DNA, ULTRAQUANTITATIVE, PCR
Hepatitis B DNA (Calc): 3.11 Log IU/mL — ABNORMAL HIGH
Hepatitis B DNA: 1280 IU/mL — ABNORMAL HIGH

## 2020-03-20 ENCOUNTER — Other Ambulatory Visit: Payer: Medicaid Other

## 2020-03-20 IMAGING — US US ABDOMEN LIMITED W/ ELASTOGRAPHY
1 series · 13 of 14 positions shown · non-contrast
Comparison: None.

CLINICAL DATA: Chronic hepatitis-B.

EXAM:
US ABDOMEN LIMITED - RIGHT UPPER QUADRANT
ULTRASOUND HEPATIC ELASTOGRAPHY
TECHNIQUE: Limited right upper quadrant abdominal ultrasound was performed. In
addition, ultrasound elastography evaluation of the liver was
performed. A region of interest was placed in the right lobe of the
liver. Following application of a compressive sonographic pulse,
shear waves were detected in the adjacent hepatic tissue and the
shear wave velocity was calculated. Multiple assessments were
performed at the selected site. Median shear wave velocity is
correlated to a Metavir fibrosis score.

[Series 1: us abdomen limited w/ elastography · 0.15mm/px · 13 of 14 slices shown]
[im 1/14]
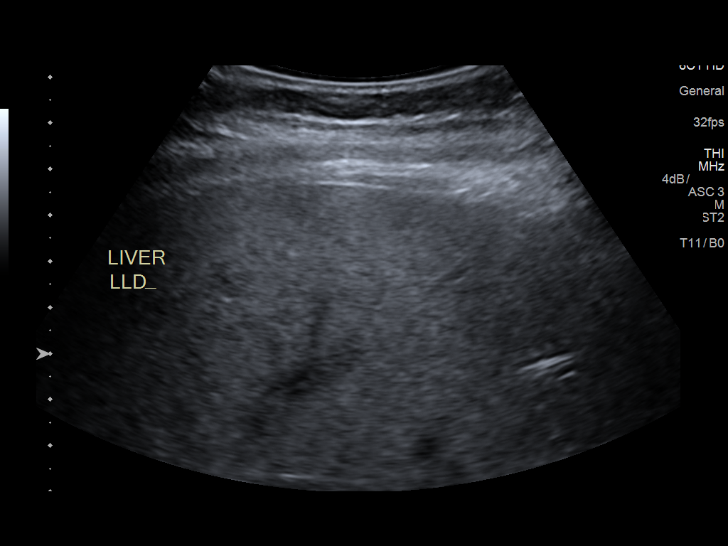
[im 2/14]
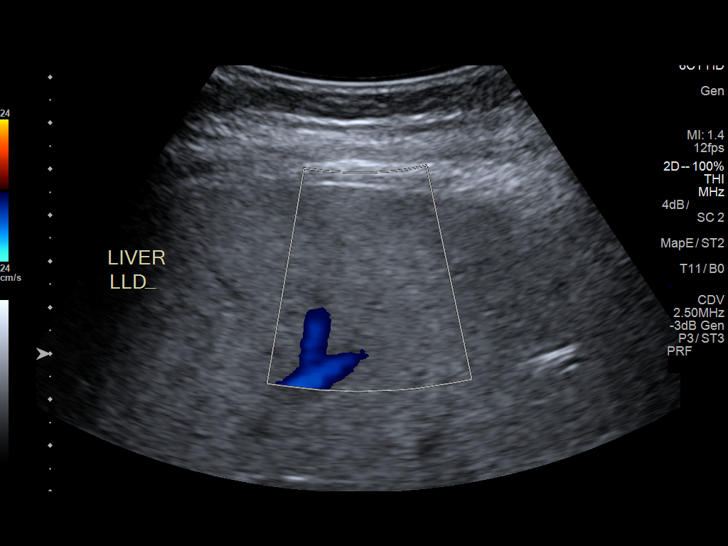
[im 3/14]
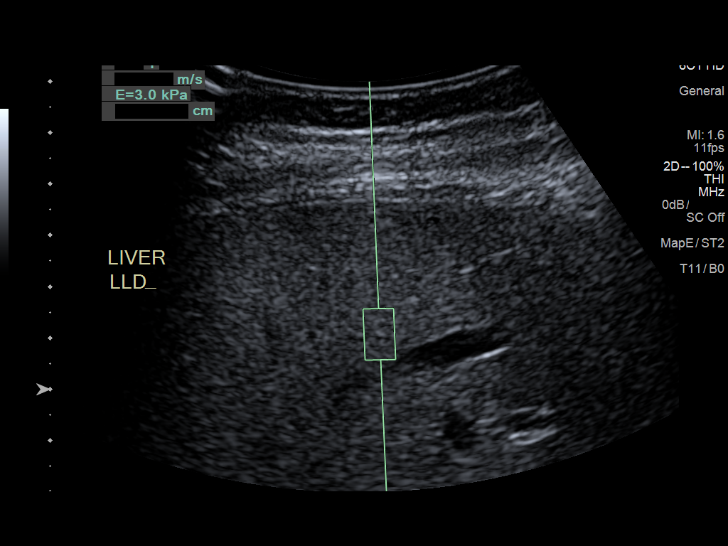
[im 4/14]
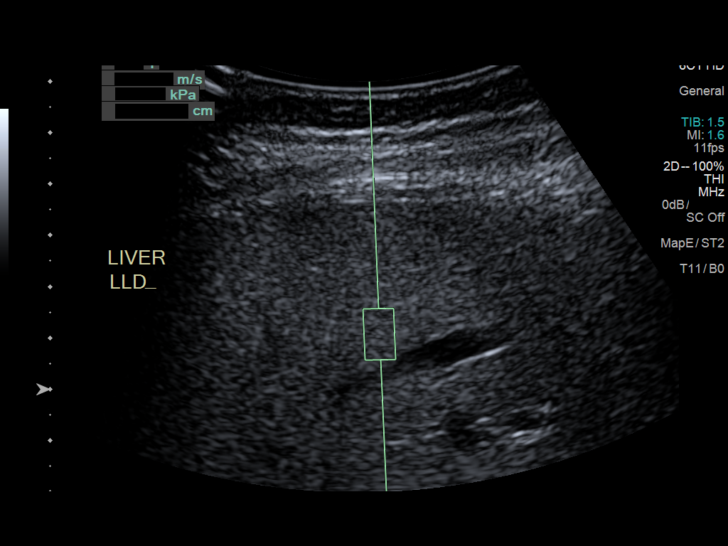
[im 5/14]
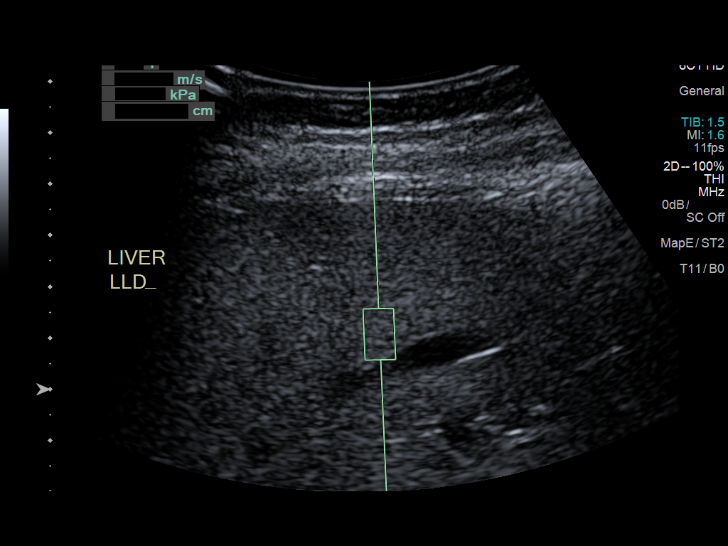
[im 6/14]
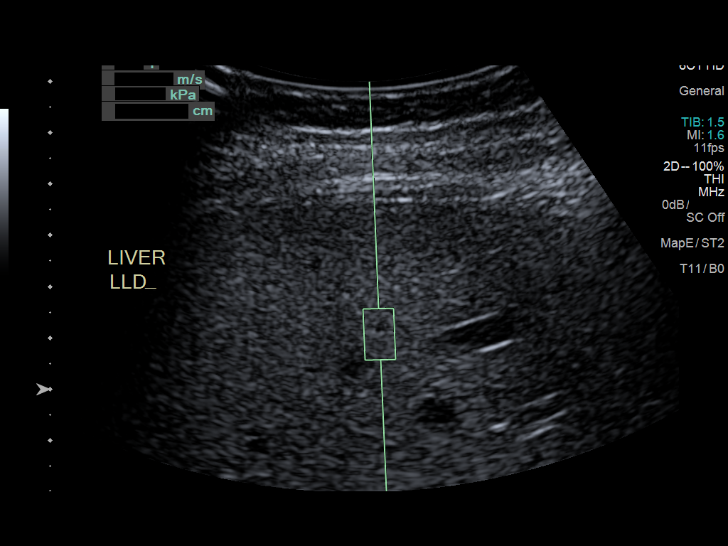
[im 8/14]
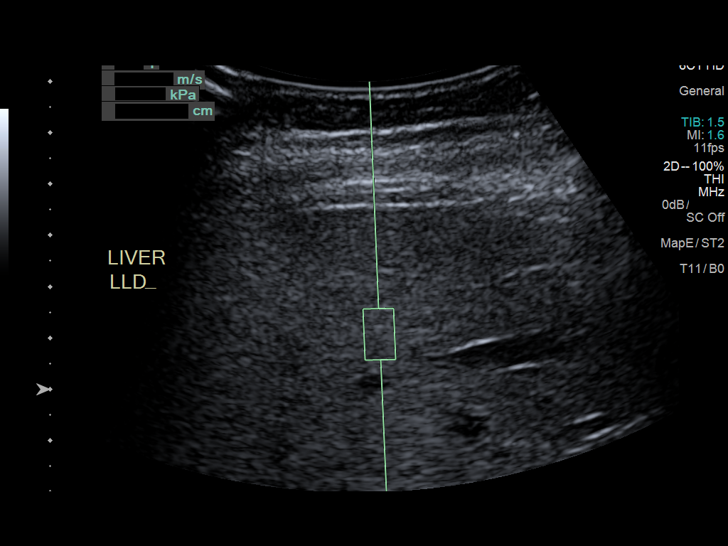
[im 9/14]
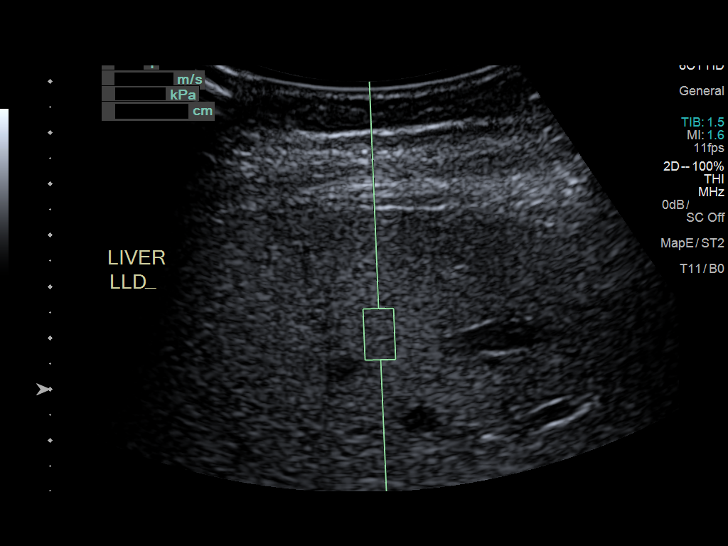
[im 10/14]
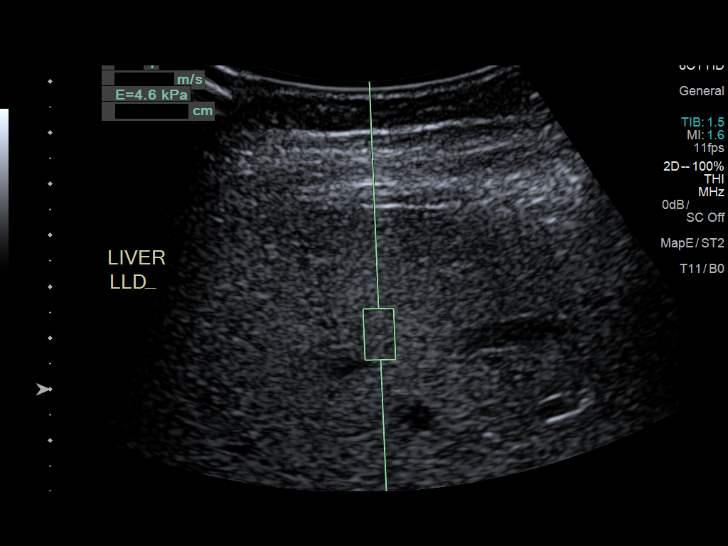
[im 11/14]
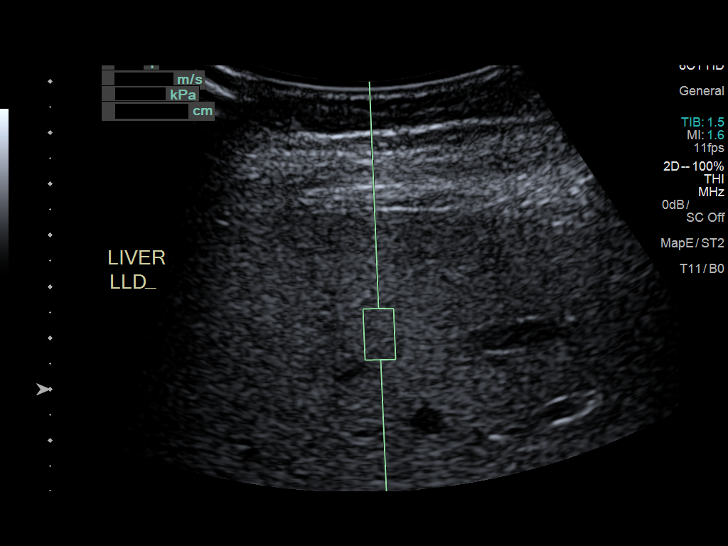
[im 12/14]
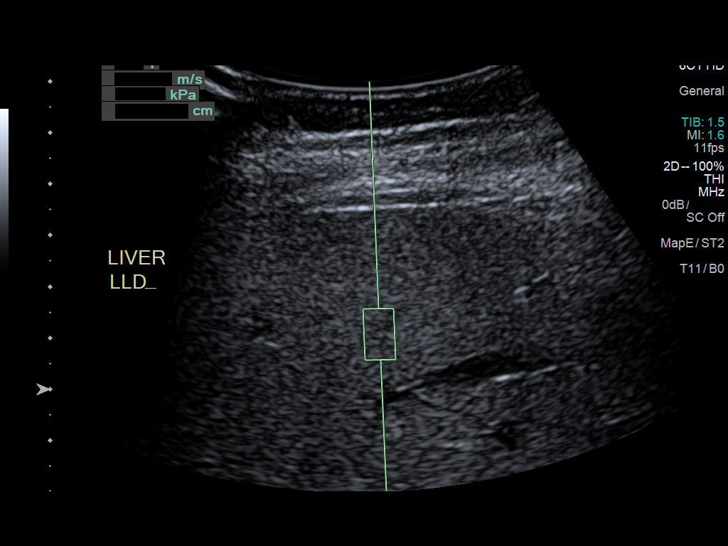
[im 13/14]
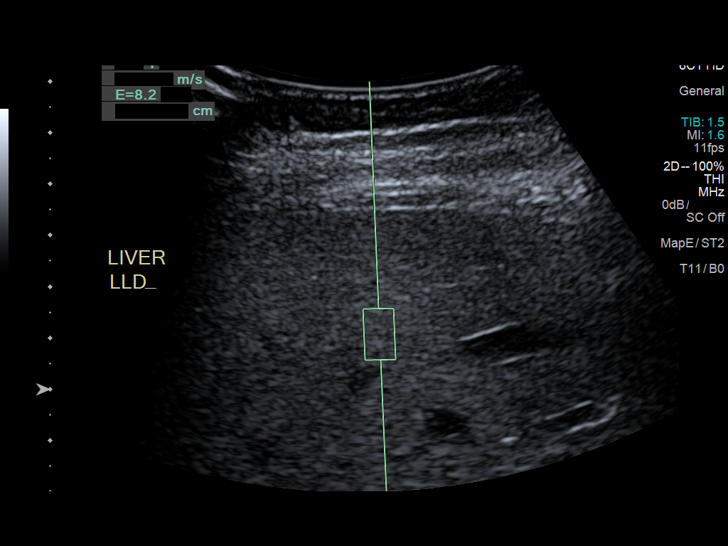
[im 14/14]
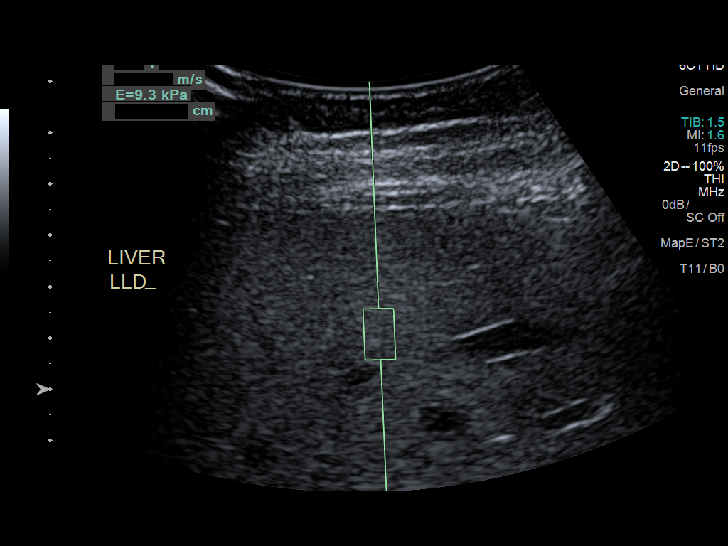

[13 of 14 positions shown; findings below may reference images not displayed]

FINDINGS: ULTRASOUND ABDOMEN LIMITED RIGHT UPPER QUADRANT

Gallbladder:

No gallstones or wall thickening visualized. No sonographic Murphy
sign noted.

Common bile duct:

Diameter: 3 mm

Liver:

No focal lesion identified. Within normal limits in parenchymal
echogenicity. Portal vein is patent on color Doppler imaging with
normal direction of blood flow towards the liver.

ULTRASOUND HEPATIC ELASTOGRAPHY

Device: Siemens Helix VTQ

Patient position: Left Lateral Decubitus

Transducer [id number]

Number of measurements: 10

Hepatic segment:  8

Median velocity:   1.90 m/sec

IQR:

IQR/Median velocity ratio:

Corresponding Metavir fibrosis score:  F2 + some F3

Risk of fibrosis: Moderate

Limitations of exam: None

Please note that abnormal shear wave velocities may also be
identified in clinical settings other than with hepatic fibrosis,
such as: acute hepatitis, elevated right heart and central venous
pressures including use of beta blockers, Lu Ie disease
(Harlal), infiltrative processes such as
mastocytosis/amyloidosis/infiltrative tumor, extrahepatic
cholestasis, in the post-prandial state, and liver transplantation.
Correlation with patient history, laboratory data, and clinical
condition recommended.
IMPRESSION: ULTRASOUND ABDOMEN: Normal right upper quadrant abdominal sonogram.
No morphologic changes of cirrhosis. No liver masses.

ULTRASOUND HEPATIC ELASTOGRAHY:

Median hepatic shear wave velocity is calculated at 1.90 m/sec.

Corresponding Metavir fibrosis score is F2 + some F3.

Risk of fibrosis is Moderate.

Follow-up: Additional testing appropriate.

## 2020-03-23 ENCOUNTER — Other Ambulatory Visit: Payer: Medicaid Other

## 2020-03-23 ENCOUNTER — Ambulatory Visit
Admission: RE | Admit: 2020-03-23 | Discharge: 2020-03-23 | Disposition: A | Discharge status: Home / Self Care | Payer: Medicaid Other | Source: Ambulatory Visit | Attending: Internal Medicine | Admitting: Internal Medicine

## 2020-03-23 DIAGNOSIS — B181 Chronic viral hepatitis B without delta-agent: Secondary | ICD-10-CM

## 2020-03-23 DIAGNOSIS — B1910 Unspecified viral hepatitis B without hepatic coma: Secondary | ICD-10-CM | POA: Diagnosis not present

## 2020-03-23 DIAGNOSIS — Z9189 Other specified personal risk factors, not elsewhere classified: Secondary | ICD-10-CM

## 2020-08-12 ENCOUNTER — Other Ambulatory Visit: Payer: Self-pay | Admitting: Internal Medicine

## 2020-09-18 ENCOUNTER — Ambulatory Visit (INDEPENDENT_AMBULATORY_CARE_PROVIDER_SITE_OTHER): Payer: Medicaid Other | Admitting: Internal Medicine

## 2020-09-18 ENCOUNTER — Encounter: Payer: Self-pay | Admitting: Internal Medicine

## 2020-09-18 ENCOUNTER — Other Ambulatory Visit: Payer: Self-pay

## 2020-09-18 VITALS — BP 109/73 | HR 75 | Temp 97.7°F | Wt 165.0 lb

## 2020-09-18 DIAGNOSIS — Z789 Other specified health status: Secondary | ICD-10-CM | POA: Diagnosis not present

## 2020-09-18 DIAGNOSIS — Z9189 Other specified personal risk factors, not elsewhere classified: Secondary | ICD-10-CM

## 2020-09-18 DIAGNOSIS — Z758 Other problems related to medical facilities and other health care: Secondary | ICD-10-CM

## 2020-09-18 DIAGNOSIS — B181 Chronic viral hepatitis B without delta-agent: Secondary | ICD-10-CM | POA: Diagnosis not present

## 2020-09-18 DIAGNOSIS — K7400 Hepatic fibrosis, unspecified: Secondary | ICD-10-CM

## 2020-09-18 NOTE — Assessment & Plan Note (Signed)
Translator used during the encounter

## 2020-09-18 NOTE — Assessment & Plan Note (Signed)
She continues on Viread and no issues.  No changes and will check labs today. rtc 6 months.

## 2020-09-18 NOTE — Assessment & Plan Note (Signed)
Will continue with HCC screening every 6 months °

## 2020-09-18 NOTE — Assessment & Plan Note (Signed)
She does not drink any alcohol.  No issues.

## 2020-09-18 NOTE — Progress Notes (Signed)
   Subjective:    Patient ID: Morgan Richardson, female    DOB: May 31, 1986, 34 y.o.   MRN: [id number]  HPI Here for follow up of chronic hepatitis B.  She continues on Viread and denies any missed doses   Last viral load just 1280 from a baseline of 576,000,000. She continues to feel well, no side effects on Viread.  Has not had issues with coverage.  No complaints today.   Review of Systems  Constitutional: Negative for fatigue and unexpected weight change.  Gastrointestinal: Negative for diarrhea and nausea.  Skin: Negative for rash.       Objective:   Physical Exam Constitutional:      Appearance: Normal appearance.  Eyes:     General: No scleral icterus. Skin:    Findings: No rash.  Neurological:     Mental Status: She is alert.  Psychiatric:        Mood and Affect: Mood normal.   SH: no alcohol        Assessment & Plan:

## 2020-09-22 LAB — HEPATITIS B DNA, ULTRAQUANTITATIVE, PCR
Hepatitis B DNA (Calc): 3.23 Log IU/mL — ABNORMAL HIGH
Hepatitis B DNA: 1690 IU/mL — ABNORMAL HIGH

## 2020-09-22 LAB — HEPATITIS B SURFACE ANTIGEN: Hepatitis B Surface Ag: REACTIVE — AB

## 2020-09-28 ENCOUNTER — Ambulatory Visit
Admission: RE | Admit: 2020-09-28 | Discharge: 2020-09-28 | Disposition: A | Discharge status: Home / Self Care | Payer: Medicaid Other | Source: Ambulatory Visit | Attending: Internal Medicine | Admitting: Internal Medicine

## 2020-09-28 DIAGNOSIS — K7400 Hepatic fibrosis, unspecified: Secondary | ICD-10-CM

## 2020-09-28 DIAGNOSIS — B181 Chronic viral hepatitis B without delta-agent: Secondary | ICD-10-CM

## 2020-09-28 DIAGNOSIS — Z9189 Other specified personal risk factors, not elsewhere classified: Secondary | ICD-10-CM

## 2021-03-19 ENCOUNTER — Other Ambulatory Visit: Payer: Self-pay

## 2021-03-19 ENCOUNTER — Ambulatory Visit (INDEPENDENT_AMBULATORY_CARE_PROVIDER_SITE_OTHER): Payer: Medicaid Other | Admitting: Internal Medicine

## 2021-03-19 VITALS — BP 107/70 | HR 54 | Temp 98.7°F

## 2021-03-19 DIAGNOSIS — Z9189 Other specified personal risk factors, not elsewhere classified: Secondary | ICD-10-CM | POA: Diagnosis not present

## 2021-03-19 DIAGNOSIS — B181 Chronic viral hepatitis B without delta-agent: Secondary | ICD-10-CM

## 2021-03-19 DIAGNOSIS — K7400 Hepatic fibrosis, unspecified: Secondary | ICD-10-CM

## 2021-03-19 LAB — COMPLETE METABOLIC PANEL WITH GFR
Albumin: 4 g/dL (ref 3.6–5.1)
Creat: 0.63 mg/dL (ref 0.50–1.10)
GFR, Est Non African American: 115 mL/min/{1.73_m2} (ref >=60)
Potassium: 4.6 mmol/L (ref 3.5–5.3)
Total Bilirubin: 0.6 mg/dL (ref 0.2–1.2)
Total Protein: 6.4 g/dL (ref 6.1–8.1)

## 2021-03-20 ENCOUNTER — Encounter: Payer: Self-pay | Admitting: Internal Medicine

## 2021-03-20 NOTE — Assessment & Plan Note (Signed)
F2/3 on previous elastography and now ultrasound noted some early parenchymal disease and concern for developing fibrosis.  Will consider repeating the elastography next year vs MRI or liver biopsy

## 2021-03-20 NOTE — Assessment & Plan Note (Signed)
Will continue with an ultrasound every 6 months.  Will schedule for now and will schedule the next one in 6 months the same day as her appt with me to reduce her number of trips due to transportation difficulties.

## 2021-03-20 NOTE — Assessment & Plan Note (Signed)
She is doing well on treatment with no concerns.  She will continue with Viread and no changes.  rtc in 6 months.

## 2021-03-20 NOTE — Progress Notes (Signed)
   Subjective:    Patient ID: Julisa Flippo, female    DOB: [birth year], 34 y.o.   MRN: [id number]  HPI Here for follow up of chronic hepatitis B She has been on Viread and no missed doses.  Her viral load has remained good and last visit was 1690.  No associated rash or diarrhea.  No issues in getting, taking or tolerating the medication.      Review of Systems  Constitutional: Negative for fatigue.  Gastrointestinal: Negative for diarrhea and nausea.  Skin: Negative for rash.       Objective:   Physical Exam Constitutional:      Appearance: Normal appearance.  Eyes:     General: No scleral icterus. Pulmonary:     Effort: Pulmonary effort is normal.  Neurological:     General: No focal deficit present.     Mental Status: She is alert.  Psychiatric:        Mood and Affect: Mood normal.           Assessment & Plan:

## 2021-03-22 LAB — COMPLETE METABOLIC PANEL WITH GFR
AG Ratio: 1.7 (calc) (ref 1.0–2.5)
ALT: 19 U/L (ref 6–29)
AST: 16 U/L (ref 10–30)
Alkaline phosphatase (APISO): 78 U/L (ref 31–125)
BUN: 13 mg/dL (ref 7–25)
CO2: 30 mmol/L (ref 20–32)
Calcium: 9 mg/dL (ref 8.6–10.2)
Chloride: 105 mmol/L (ref 98–110)
GFR, Est African American: 133 mL/min/{1.73_m2} (ref >=60)
Globulin: 2.4 g/dL (calc) (ref 1.9–3.7)
Glucose, Bld: 170 mg/dL — ABNORMAL HIGH (ref 65–99)
Sodium: 139 mmol/L (ref 135–146)

## 2021-03-22 LAB — HEPATITIS B DNA, ULTRAQUANTITATIVE, PCR
Hepatitis B DNA (Calc): 8.96 Log IU/mL — ABNORMAL HIGH
Hepatitis B DNA: 907000000 IU/mL — ABNORMAL HIGH

## 2021-04-05 ENCOUNTER — Ambulatory Visit
Admission: RE | Admit: 2021-04-05 | Discharge: 2021-04-05 | Disposition: A | Discharge status: Home / Self Care | Payer: Medicaid Other | Source: Ambulatory Visit | Attending: Internal Medicine | Admitting: Internal Medicine

## 2021-04-05 DIAGNOSIS — B1910 Unspecified viral hepatitis B without hepatic coma: Secondary | ICD-10-CM | POA: Diagnosis not present

## 2021-04-05 DIAGNOSIS — B181 Chronic viral hepatitis B without delta-agent: Secondary | ICD-10-CM

## 2021-04-05 DIAGNOSIS — Z9189 Other specified personal risk factors, not elsewhere classified: Secondary | ICD-10-CM

## 2021-09-30 ENCOUNTER — Ambulatory Visit: Payer: Medicaid Other | Admitting: Internal Medicine

## 2021-09-30 ENCOUNTER — Encounter: Payer: Self-pay | Admitting: Internal Medicine

## 2021-09-30 ENCOUNTER — Other Ambulatory Visit: Payer: Self-pay

## 2021-09-30 ENCOUNTER — Ambulatory Visit
Admission: RE | Admit: 2021-09-30 | Discharge: 2021-09-30 | Disposition: A | Discharge status: Home / Self Care | Payer: Medicaid Other | Source: Ambulatory Visit | Attending: Internal Medicine | Admitting: Internal Medicine

## 2021-09-30 VITALS — BP 102/69 | HR 64 | Temp 98.0°F | Wt 158.0 lb

## 2021-09-30 DIAGNOSIS — Z23 Encounter for immunization: Secondary | ICD-10-CM

## 2021-09-30 DIAGNOSIS — Z9189 Other specified personal risk factors, not elsewhere classified: Secondary | ICD-10-CM

## 2021-09-30 DIAGNOSIS — B1910 Unspecified viral hepatitis B without hepatic coma: Secondary | ICD-10-CM | POA: Diagnosis not present

## 2021-09-30 DIAGNOSIS — K7689 Other specified diseases of liver: Secondary | ICD-10-CM | POA: Diagnosis not present

## 2021-09-30 DIAGNOSIS — B181 Chronic viral hepatitis B without delta-agent: Secondary | ICD-10-CM

## 2021-09-30 NOTE — Assessment & Plan Note (Signed)
Screened via ultrasound today and results pending

## 2021-09-30 NOTE — Progress Notes (Signed)
   Subjective:    Patient ID: Morgan Richardson, female    DOB: [birth year], 35 y.o.   MRN: [id number]  HPI Here for follow up of chronic active hepatitis B She has been on chronic Viread and has reported no missed doses but last visit her hepatitis B DNA was noted to be 907,000,000 IU/mL which was up from her treatment baseline of just around 1,000.  She did endorse after the discussion some regular missed doses.  Interestingly though, her AST and ALT were within normal range at that time.  She otherwise is having no issues.  She had her HCC screening with an ultrasound this am and results pending.     Review of Systems  Constitutional:  Negative for fatigue and unexpected weight change.  Gastrointestinal:  Negative for diarrhea and nausea.  Skin:  Negative for rash.      Objective:   Physical Exam Eyes:     General: No scleral icterus. Pulmonary:     Effort: Pulmonary effort is normal.  Abdominal:     Palpations: Abdomen is soft.  Skin:    Findings: No rash.  Neurological:     Mental Status: She is alert.   SH: no alcohol       Assessment & Plan:

## 2021-09-30 NOTE — Assessment & Plan Note (Addendum)
Elevated and not controlled.  I discussed resistance and concern for Viread resistance but I was unable to check the genotype.  I will recheck the viral load and if elevated, will consider a change of treatment.  She will return in 1 month.

## 2021-10-03 LAB — HEPATIC FUNCTION PANEL
AG Ratio: 1.4 (calc) (ref 1.0–2.5)
ALT: 20 U/L (ref 6–29)
AST: 18 U/L (ref 10–30)
Albumin: 4 g/dL (ref 3.6–5.1)
Alkaline phosphatase (APISO): 106 U/L (ref 31–125)
Bilirubin, Direct: 0.2 mg/dL (ref 0.0–0.2)
Globulin: 2.8 g/dL (calc) (ref 1.9–3.7)
Indirect Bilirubin: 0.6 mg/dL (calc) (ref 0.2–1.2)
Total Bilirubin: 0.8 mg/dL (ref 0.2–1.2)
Total Protein: 6.8 g/dL (ref 6.1–8.1)

## 2021-10-03 LAB — HEPATITIS B DNA, ULTRAQUANTITATIVE, PCR
Hepatitis B DNA (Calc): 8.88 Log IU/mL — ABNORMAL HIGH
Hepatitis B DNA: 750000000 IU/mL — ABNORMAL HIGH

## 2021-10-31 ENCOUNTER — Other Ambulatory Visit: Payer: Self-pay | Admitting: Internal Medicine

## 2021-10-31 NOTE — Telephone Encounter (Signed)
Per chart, tenofovir has not been dispensed since 09/2020 - okay to refill or do you want to wait until her appointment on 1/11 to discuss?

## 2021-11-06 ENCOUNTER — Other Ambulatory Visit: Payer: Self-pay

## 2021-11-06 ENCOUNTER — Ambulatory Visit: Payer: Medicaid Other | Admitting: Internal Medicine

## 2021-11-06 ENCOUNTER — Encounter: Payer: Self-pay | Admitting: Internal Medicine

## 2021-11-06 VITALS — BP 115/71 | HR 87 | Resp 16 | Ht 60.0 in | Wt 160.0 lb

## 2021-11-06 DIAGNOSIS — Z9189 Other specified personal risk factors, not elsewhere classified: Secondary | ICD-10-CM

## 2021-11-06 DIAGNOSIS — B181 Chronic viral hepatitis B without delta-agent: Secondary | ICD-10-CM

## 2021-11-06 MED ORDER — TENOFOVIR DISOPROXIL FUMARATE 300 MG PO TABS: 300.0000 mg | ORAL_TABLET | Freq: Every day | ORAL | 11 refills | Status: DC

## 2021-11-06 NOTE — Progress Notes (Signed)
° °  Subjective:    Patient ID: Morgan Richardson, female    DOB: [birth year], 36 y.o.   MRN: [id number]  HPI Here for follow up of chronic hepatitis B.  She has been on viread for E Ag positive infection, mild transaminitis of ALT  peak of 55, elastography F2/3 and initial DNA 576 million.  She has had good viral suppression until last Spring noted to be up again to 907 million and on follow up last month again 750 million, c/w baseline.  In review of the medication with pharmacy, it appears she was not refilling the medication.     Review of Systems  Constitutional:  Negative for fatigue and unexpected weight change.  Gastrointestinal:  Negative for diarrhea and nausea.  Skin:  Negative for rash.      Objective:   Physical Exam Eyes:     General: No scleral icterus. Pulmonary:     Effort: Pulmonary effort is normal.  Skin:    Findings: No rash.  Neurological:     General: No focal deficit present.     Mental Status: She is alert.   SH: no alcohol       Assessment & Plan:

## 2021-11-06 NOTE — Assessment & Plan Note (Addendum)
Worsened.  After further discussion with her it appears she has not filled the medication in months and explains why she has not been on it.  She relates a history of fatigue on the medication so she had stopped it.  I discussed again the results of the positive viral load to 750 million and she is interested in restarting and will pick it up today.  I will have her return in 3 months and recheck her blood then.    40 minutes spent including 20 minutes face to face in discussion of above in assuring she can stay on the medication, importance of it and relapse of hepatitis B

## 2021-11-06 NOTE — Assessment & Plan Note (Signed)
Ultrasound done last month without new concerns, no notable HCC.  Will continue to screen

## 2022-02-05 ENCOUNTER — Ambulatory Visit: Payer: Medicaid Other | Admitting: Internal Medicine

## 2022-02-05 ENCOUNTER — Other Ambulatory Visit: Payer: Self-pay

## 2022-02-05 ENCOUNTER — Encounter: Payer: Self-pay | Admitting: Internal Medicine

## 2022-02-05 VITALS — BP 105/69 | HR 83 | Resp 16 | Ht 60.0 in | Wt 160.0 lb

## 2022-02-05 DIAGNOSIS — Z5181 Encounter for therapeutic drug level monitoring: Secondary | ICD-10-CM

## 2022-02-05 DIAGNOSIS — K7400 Hepatic fibrosis, unspecified: Secondary | ICD-10-CM | POA: Diagnosis not present

## 2022-02-05 DIAGNOSIS — B181 Chronic viral hepatitis B without delta-agent: Secondary | ICD-10-CM | POA: Diagnosis not present

## 2022-02-05 NOTE — Progress Notes (Signed)
? ?  Subjective:  ? ? Patient ID: Morgan Richardson, female    DOB: 1986-08-11, 36 y.o.   MRN: [id number] ? ?HPI ?Here for follow up of chronic active hepatitis B. ?She is back on tenofovir after stopping due to some side effects including fatigue and no new complaints.  Her viral load had suppressed nicely with a decrease to below 2,000 but off the medication, came back up to 750 million.  No significant transaminitis then.  She agreed she would restart the same medication and is here now for follow up.   ? ? ?Review of Systems  ?Constitutional:  Negative for fatigue.  ?Skin:  Negative for rash.  ? ?   ?Objective:  ? Physical Exam ?Eyes:  ?   General: No scleral icterus. ?Pulmonary:  ?   Effort: Pulmonary effort is normal.  ?Neurological:  ?   Mental Status: She is alert.  ?Psychiatric:     ?   Mood and Affect: Mood normal.  ? ?SH: no alcohol ? ? ? ?   ?Assessment & Plan:  ? ? ?

## 2022-02-05 NOTE — Assessment & Plan Note (Signed)
Has worsened and now back on medication.  I anticipate improvement of her viral load now and will recheck today.  If declining as expected, she can return in 6 months.   ?

## 2022-02-05 NOTE — Assessment & Plan Note (Signed)
Will check her creat on tenofovir ?

## 2022-02-05 NOTE — Assessment & Plan Note (Signed)
Will check her hepatic function panel with the recent active virus.   ?

## 2022-02-09 LAB — HEPATIC FUNCTION PANEL
AG Ratio: 1.5 (calc) (ref 1.0–2.5)
ALT: 50 U/L — ABNORMAL HIGH (ref 6–29)
AST: 30 U/L (ref 10–30)
Albumin: 4 g/dL (ref 3.6–5.1)
Alkaline phosphatase (APISO): 108 U/L (ref 31–125)
Bilirubin, Direct: 0.2 mg/dL (ref 0.0–0.2)
Globulin: 2.7 g/dL (calc) (ref 1.9–3.7)
Indirect Bilirubin: 0.5 mg/dL (calc) (ref 0.2–1.2)
Total Bilirubin: 0.7 mg/dL (ref 0.2–1.2)
Total Protein: 6.7 g/dL (ref 6.1–8.1)

## 2022-02-09 LAB — HEPATITIS B DNA, ULTRAQUANTITATIVE, PCR
Hepatitis B DNA (Calc): 3.99 Log IU/mL — ABNORMAL HIGH
Hepatitis B DNA: 9810 IU/mL — ABNORMAL HIGH

## 2022-02-09 LAB — BASIC METABOLIC PANEL
BUN: 13 mg/dL (ref 7–25)
CO2: 31 mmol/L (ref 20–32)
Calcium: 9.3 mg/dL (ref 8.6–10.2)
Chloride: 105 mmol/L (ref 98–110)
Creat: 0.64 mg/dL (ref 0.50–0.97)
Glucose, Bld: 113 mg/dL — ABNORMAL HIGH (ref 65–99)
Potassium: 3.9 mmol/L (ref 3.5–5.3)
Sodium: 141 mmol/L (ref 135–146)

## 2022-04-23 IMAGING — US US ABDOMEN LIMITED
1 series · 14 of 25 positions shown · non-contrast
Comparison: April 05, 2021.

CLINICAL DATA: Hepatitis B, HCC screen

EXAM:
ULTRASOUND ABDOMEN LIMITED RIGHT UPPER QUADRANT

[Series 1: us abdomen limited · 0.15mm/px · 14 of 47 slices shown]
[im 1/47]
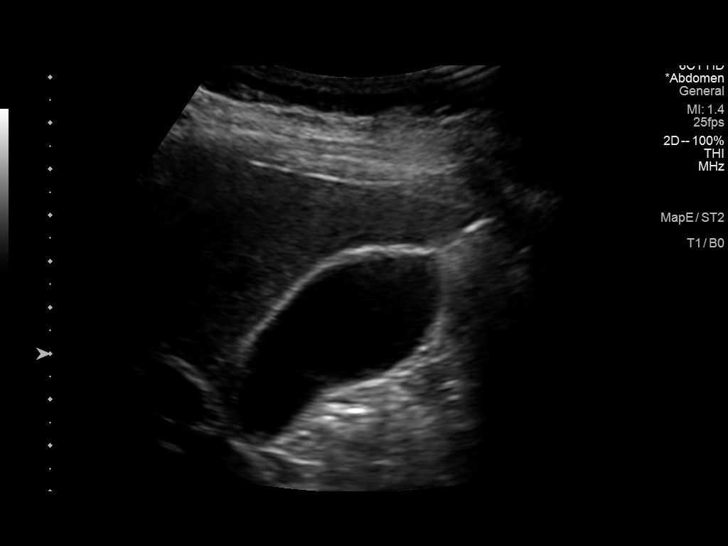
[im 4/47]
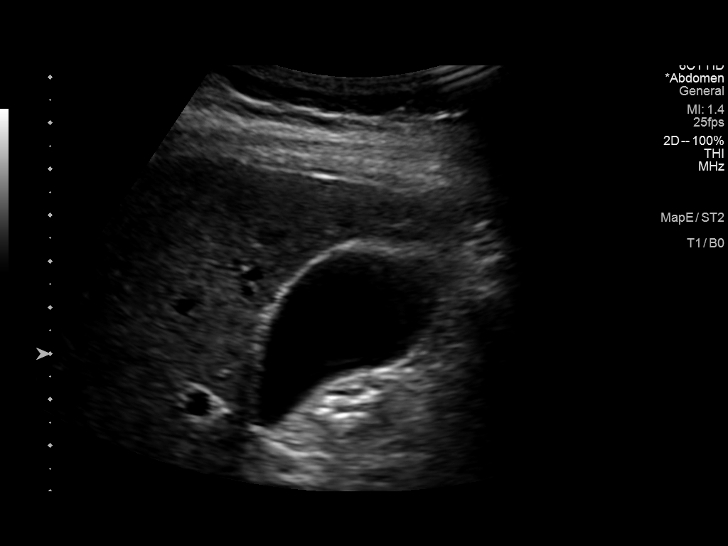
[im 8/47]
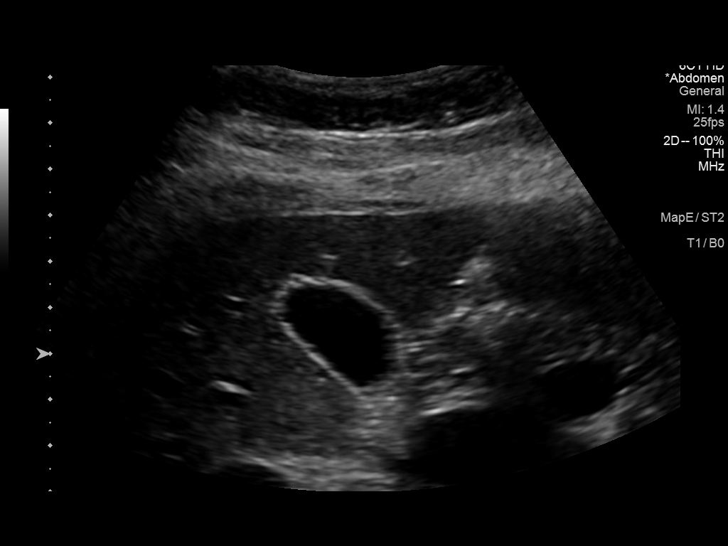
[im 12/47]
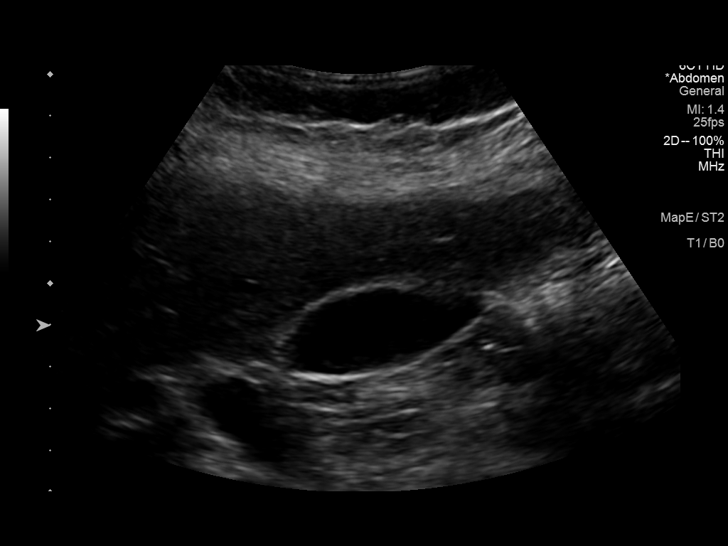
[im 16/47]
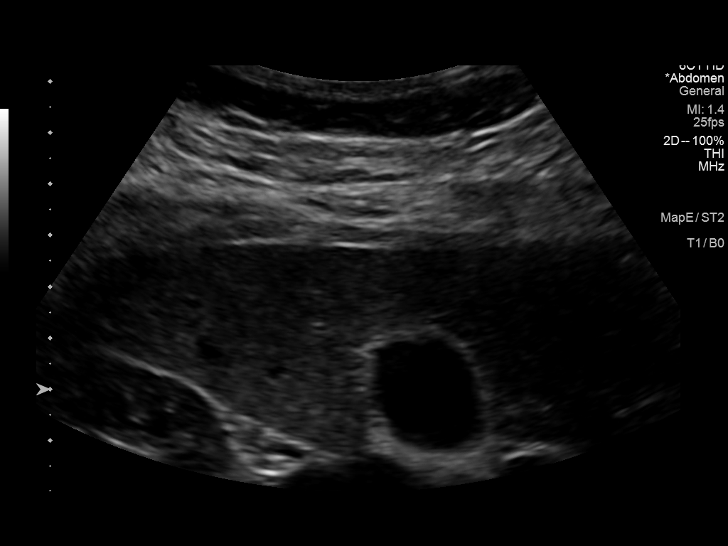
[im 18/47]
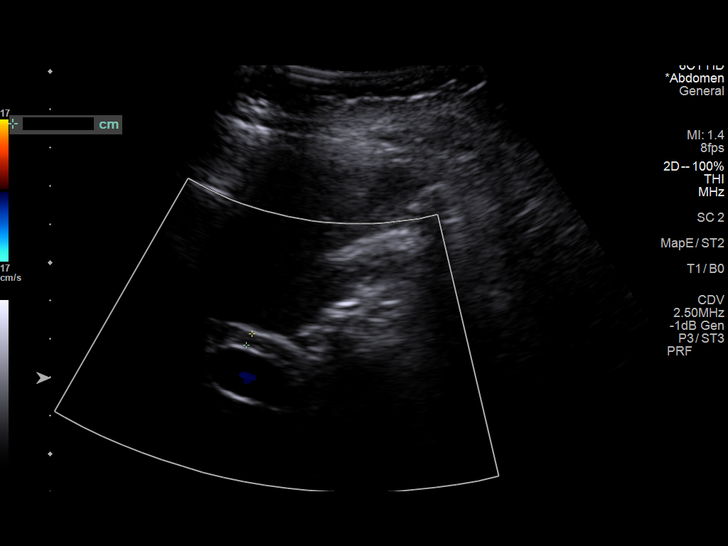
[im 22/47]
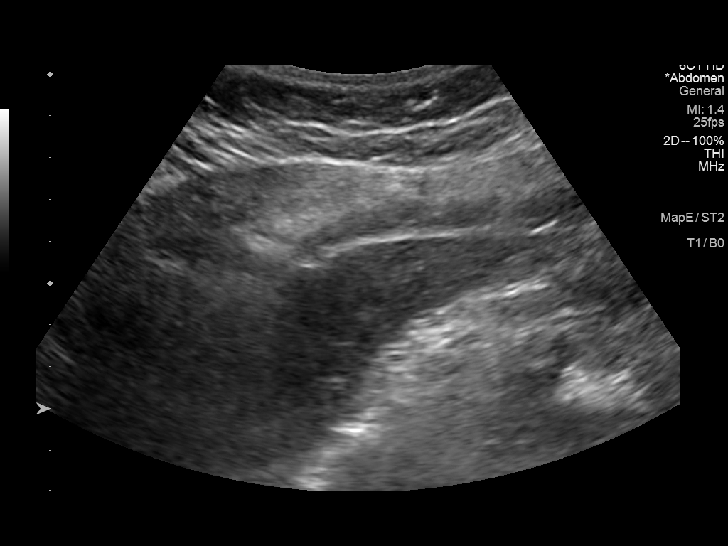
[im 25/47]
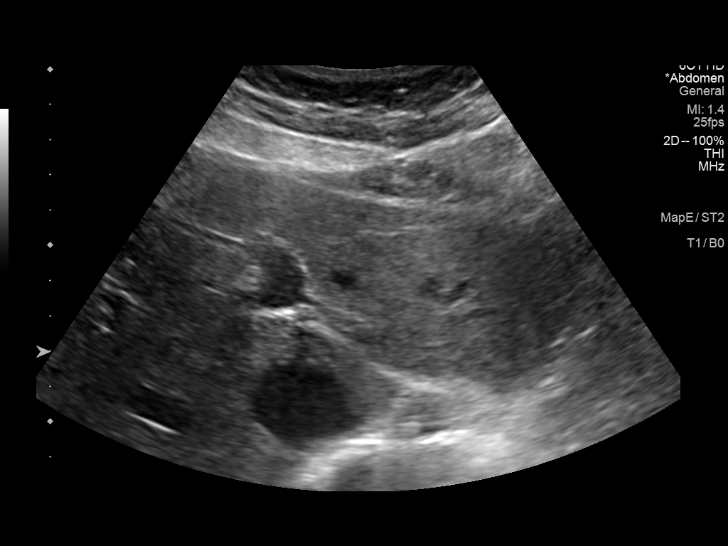
[im 29/47]
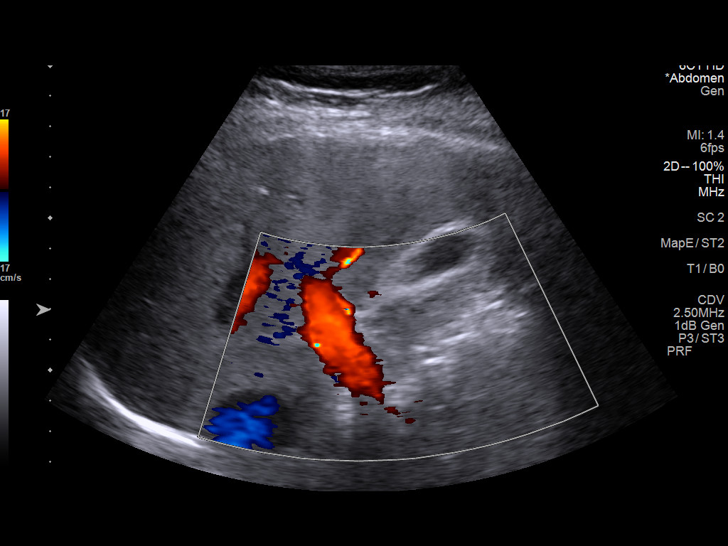
[im 31/47]
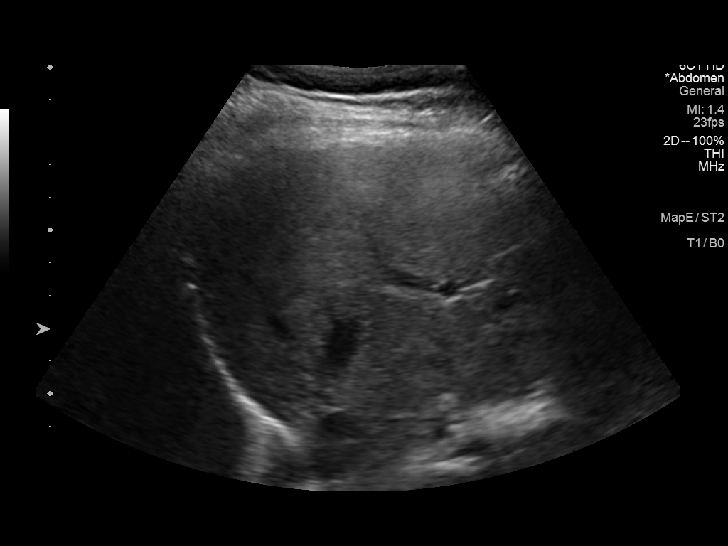
[im 35/47]
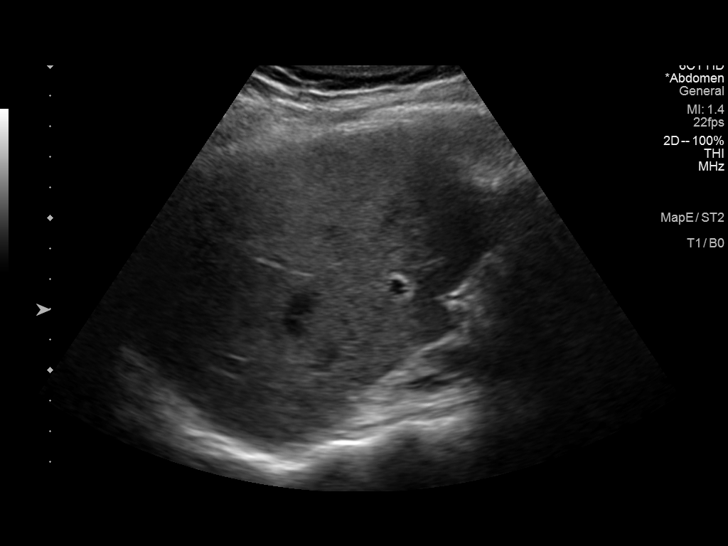
[im 39/47]
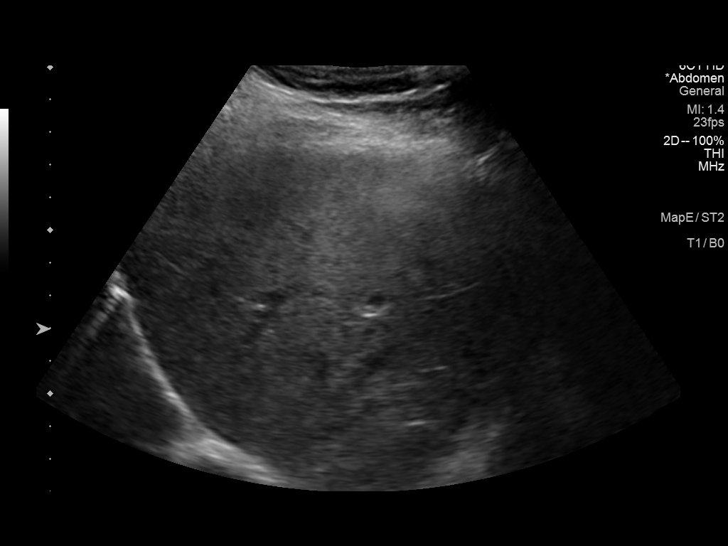
[im 43/47]
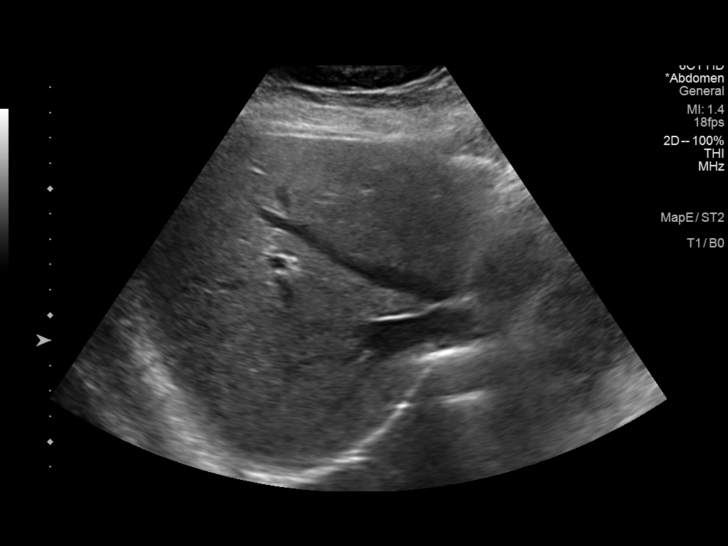
[im 47/47]
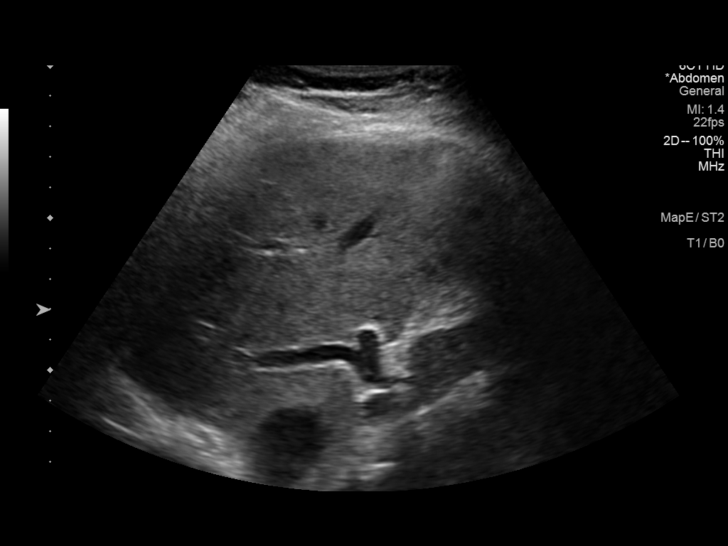

[14 of 25 positions shown; findings below may reference images not displayed]

FINDINGS: Gallbladder:

No gallstones or wall thickening visualized. No sonographic Murphy
sign noted by sonographer.

Common bile duct:

Diameter: 3 mm

Liver:

No focal lesion identified. Increased parenchymal echogenicity.
Portal vein is patent on color Doppler imaging with normal direction
of blood flow towards the liver.

Other: None.
IMPRESSION: Increased parenchymal echogenicity of the liver. This is a
nonspecific finding but is most commonly seen with hepatic steatosis
or hepatocellular disease. There are no obvious focal liver lesions.

## 2022-06-12 ENCOUNTER — Other Ambulatory Visit (HOSPITAL_COMMUNITY): Payer: Self-pay

## 2022-06-13 ENCOUNTER — Telehealth: Payer: Self-pay

## 2022-06-13 NOTE — Telephone Encounter (Signed)
Patient's spouse called to follow up on concerns with getting Viread filled. States that pharmacy is not able to fill prescription due to not being covered by insurance. Will forward message to pharmacy team to look into coverage. Juanita Laster, RMA

## 2022-06-16 ENCOUNTER — Other Ambulatory Visit (HOSPITAL_COMMUNITY): Payer: Self-pay

## 2022-06-16 NOTE — Telephone Encounter (Signed)
Patient's spouse presented to the clinic in person this morning and spoke with reception.  Expressed concern because patient was unable to get a refill of Viread, due to insurance will not cover medication. Patient has been out of medication since 06/13/22.  Per Lupita Leash, patient assistance is not available for Viread but would be available if patient can change to [facility].  Spouse (Jehulal) requests a call back at [phone] regarding plan for medication refill.  Routed to provider and pharmacy team.  Wyvonne Lenz, RN

## 2022-06-17 NOTE — Telephone Encounter (Signed)
I have tried calling her twice and to no avail, I was able to leave a message.

## 2022-06-17 NOTE — Telephone Encounter (Signed)
I assume it would be fine for her to take [facility] since she has been on Viread and done well. Are you ok with this Dr. Luciana Axe?

## 2022-06-17 NOTE — Telephone Encounter (Signed)
Lupita Leash - can you start patient assistance application for [facility] please?   Thank you!

## 2022-06-18 ENCOUNTER — Other Ambulatory Visit (HOSPITAL_COMMUNITY): Payer: Self-pay

## 2022-06-18 ENCOUNTER — Telehealth: Payer: Self-pay

## 2022-06-18 NOTE — Telephone Encounter (Signed)
[facility] Patient Advocate Encounter ? ?Completed and sent [employer] application for Vemlidy for this patient who is uninsured.   ? ?Patient assistance phone number for follow up is [phone].  ? ?This encounter will be updated until final determination.  ? ?Rubina Basinski, CPhT ?Specialty Pharmacy Patient Advocate ?[facility] ?Phone: [phone] ?Fax:  [phone]  ?

## 2022-06-19 ENCOUNTER — Other Ambulatory Visit: Payer: Self-pay | Admitting: Pharmacist

## 2022-06-19 ENCOUNTER — Telehealth: Payer: Self-pay

## 2022-06-19 DIAGNOSIS — B181 Chronic viral hepatitis B without delta-agent: Secondary | ICD-10-CM

## 2022-06-19 MED ORDER — VEMLIDY 25 MG PO TABS: 1.0000 | ORAL_TABLET | Freq: Every day | ORAL | 11 refills | Status: DC

## 2022-06-19 NOTE — Telephone Encounter (Addendum)
[facility] Patient Advocate Encounter  Completed and sent [employer] application for Vemlidy for this patient who is uninsured.  Pt ID # [id number]  Patient is approved 06/19/22 through 10/26/22.  Faxing hardcopy script to [facility] @ [phone].  Medication will then be shipped to the patient home.   Clearance Coots, CPhT Specialty Pharmacy Patient [facility] Phone: [phone] Fax:  [phone]

## 2022-08-20 ENCOUNTER — Ambulatory Visit: Payer: Medicaid Other | Admitting: Internal Medicine

## 2022-08-29 ENCOUNTER — Other Ambulatory Visit: Payer: Self-pay

## 2022-08-29 ENCOUNTER — Other Ambulatory Visit (HOSPITAL_COMMUNITY): Payer: Self-pay

## 2022-08-29 ENCOUNTER — Encounter: Payer: Self-pay | Admitting: Internal Medicine

## 2022-08-29 ENCOUNTER — Ambulatory Visit (INDEPENDENT_AMBULATORY_CARE_PROVIDER_SITE_OTHER): Payer: Medicaid Other | Admitting: Internal Medicine

## 2022-08-29 VITALS — Resp 16 | Ht 60.0 in | Wt 163.0 lb

## 2022-08-29 DIAGNOSIS — Z23 Encounter for immunization: Secondary | ICD-10-CM | POA: Diagnosis not present

## 2022-08-29 DIAGNOSIS — Z9189 Other specified personal risk factors, not elsewhere classified: Secondary | ICD-10-CM

## 2022-08-29 DIAGNOSIS — K7400 Hepatic fibrosis, unspecified: Secondary | ICD-10-CM | POA: Diagnosis present

## 2022-08-29 DIAGNOSIS — B181 Chronic viral hepatitis B without delta-agent: Secondary | ICD-10-CM

## 2022-08-29 NOTE — Progress Notes (Signed)
   Subjective:    Patient ID: Morgan Richardson, female    DOB: 10-10-1986, 36 y.o.   MRN: [id number]  HPI Here for follow up of chronic active hepatitis B She has continued on Vemlidy with no issues.  Getting medication through [employer].  Her only concern is renewal of the medication but otherwise doing well.    Review of Systems  Constitutional:  Negative for fatigue.  Gastrointestinal:  Negative for diarrhea.  Skin:  Negative for rash.       Objective:   Physical Exam Eyes:     General: No scleral icterus. Pulmonary:     Effort: Pulmonary effort is normal.  Neurological:     Mental Status: She is alert.           Assessment & Plan:

## 2022-08-29 NOTE — Assessment & Plan Note (Addendum)
She is doing well and will reach out to pharmacy to be sure she has continued access to [facility].  No changes and labs today. She will otherwise rtc in 6 months.   I have personally spent 32 minutes involved in face-to-face and non-face-to-face activities for this patient on the day of the visit. Professional time spent includes the following activities: Preparing to see the patient (review of tests), Obtaining and/or reviewing separately obtained history (admission/discharge record), Performing a medically appropriate examination and/or evaluation , Ordering medications/tests/procedures, referring and communicating with other health care professionals, Documenting clinical information in the EMR, Independently interpreting results (not separately reported), Communicating results to the patient/family/caregiver, Counseling and educating the patient/family/caregiver and Care coordination (not separately reported).

## 2022-09-01 ENCOUNTER — Other Ambulatory Visit (HOSPITAL_COMMUNITY): Payer: Self-pay

## 2022-09-01 LAB — COMPLETE METABOLIC PANEL WITH GFR
AG Ratio: 1.4 (calc) (ref 1.0–2.5)
ALT: 23 U/L (ref 6–29)
AST: 18 U/L (ref 10–30)
Albumin: 4.1 g/dL (ref 3.6–5.1)
Alkaline phosphatase (APISO): 115 U/L (ref 31–125)
BUN: 15 mg/dL (ref 7–25)
CO2: 27 mmol/L (ref 20–32)
Calcium: 9.4 mg/dL (ref 8.6–10.2)
Chloride: 106 mmol/L (ref 98–110)
Creat: 0.52 mg/dL (ref 0.50–0.97)
Globulin: 3 g/dL (calc) (ref 1.9–3.7)
Glucose, Bld: 94 mg/dL (ref 65–99)
Potassium: 4.1 mmol/L (ref 3.5–5.3)
Sodium: 140 mmol/L (ref 135–146)
Total Bilirubin: 0.4 mg/dL (ref 0.2–1.2)
Total Protein: 7.1 g/dL (ref 6.1–8.1)
eGFR: 122 mL/min/{1.73_m2} (ref >=60)

## 2022-09-01 LAB — HEPATITIS B DNA, ULTRAQUANTITATIVE, PCR
Hepatitis B DNA: 127000 IU/mL — ABNORMAL HIGH
Hepatitis B virus DNA: 5.1 Log IU/mL — ABNORMAL HIGH

## 2022-09-10 ENCOUNTER — Telehealth: Payer: Self-pay

## 2022-09-10 NOTE — Telephone Encounter (Signed)
[facility] Patient Advocate Encounter  Completed and sent Support Path application for [facility] for this patient who is uninsured.    Patient is approved 09/10/22 through 10/27/23.  Medication is being filled with [facility] [phone].   Clearance Coots, CPhT Specialty Pharmacy Patient [facility] Phone: [phone] Fax:  [phone]

## 2022-12-18 ENCOUNTER — Other Ambulatory Visit (HOSPITAL_COMMUNITY): Payer: Self-pay

## 2022-12-19 ENCOUNTER — Other Ambulatory Visit: Payer: Self-pay | Admitting: Internal Medicine

## 2022-12-19 MED ORDER — ENTECAVIR 0.5 MG PO TABS: 0.5000 mg | ORAL_TABLET | Freq: Every day | ORAL | 11 refills | Status: DC

## 2023-03-03 ENCOUNTER — Ambulatory Visit: Payer: Medicaid Other | Admitting: Internal Medicine

## 2023-03-03 ENCOUNTER — Encounter: Payer: Self-pay | Admitting: Internal Medicine

## 2023-03-03 ENCOUNTER — Other Ambulatory Visit: Payer: Self-pay

## 2023-03-03 VITALS — BP 103/69 | HR 94 | Temp 98.5°F | Ht 60.0 in | Wt 167.0 lb

## 2023-03-03 DIAGNOSIS — Z758 Other problems related to medical facilities and other health care: Secondary | ICD-10-CM | POA: Diagnosis not present

## 2023-03-03 DIAGNOSIS — Z5181 Encounter for therapeutic drug level monitoring: Secondary | ICD-10-CM | POA: Diagnosis not present

## 2023-03-03 DIAGNOSIS — Z9189 Other specified personal risk factors, not elsewhere classified: Secondary | ICD-10-CM

## 2023-03-03 DIAGNOSIS — Z603 Acculturation difficulty: Secondary | ICD-10-CM

## 2023-03-03 DIAGNOSIS — B181 Chronic viral hepatitis B without delta-agent: Secondary | ICD-10-CM

## 2023-03-03 NOTE — Progress Notes (Signed)
   Subjective:    Patient ID: Morgan Richardson, female    DOB: 1986/02/08, 37 y.o.   MRN: [id number]  HPI Here for follow up of chronic active hepatitis B She was able to get medicaid and now on entecavir in place of vemlidy.  She is having no issues with this with good tolerance.  No complaints.    Review of Systems  Constitutional:  Negative for fatigue.  Gastrointestinal:  Negative for diarrhea.  Skin:  Negative for rash.       Objective:   Physical Exam Eyes:     General: No scleral icterus. Pulmonary:     Effort: Pulmonary effort is normal.  Neurological:     Mental Status: She is alert.   SH: no alcohol        Assessment & Plan:

## 2023-03-03 NOTE — Assessment & Plan Note (Signed)
Will check LFTs on the new medication

## 2023-03-03 NOTE — Assessment & Plan Note (Addendum)
Will schedule for folllow up ultrasound for HCC screening.

## 2023-03-03 NOTE — Assessment & Plan Note (Signed)
Will check the LFTs and DNA

## 2023-03-03 NOTE — Assessment & Plan Note (Signed)
Discussion via interpreter services

## 2023-03-06 LAB — HEPATITIS B DNA, ULTRAQUANTITATIVE, PCR
Hepatitis B DNA: 11300 IU/mL — ABNORMAL HIGH
Hepatitis B virus DNA: 4.05 Log IU/mL — ABNORMAL HIGH

## 2023-03-06 LAB — HEPATIC FUNCTION PANEL
AG Ratio: 1.5 (calc) (ref 1.0–2.5)
ALT: 30 U/L — ABNORMAL HIGH (ref 6–29)
AST: 19 U/L (ref 10–30)
Albumin: 4 g/dL (ref 3.6–5.1)
Alkaline phosphatase (APISO): 104 U/L (ref 31–125)
Bilirubin, Direct: 0.1 mg/dL (ref 0.0–0.2)
Globulin: 2.6 g/dL (calc) (ref 1.9–3.7)
Indirect Bilirubin: 0.3 mg/dL (calc) (ref 0.2–1.2)
Total Bilirubin: 0.4 mg/dL (ref 0.2–1.2)
Total Protein: 6.6 g/dL (ref 6.1–8.1)

## 2023-04-06 ENCOUNTER — Ambulatory Visit
Admission: RE | Admit: 2023-04-06 | Discharge: 2023-04-06 | Disposition: A | Discharge status: Home / Self Care | Payer: Medicaid Other | Source: Ambulatory Visit | Attending: Internal Medicine | Admitting: Internal Medicine

## 2023-04-06 DIAGNOSIS — B1910 Unspecified viral hepatitis B without hepatic coma: Secondary | ICD-10-CM | POA: Diagnosis not present

## 2023-04-06 DIAGNOSIS — B181 Chronic viral hepatitis B without delta-agent: Secondary | ICD-10-CM

## 2023-04-06 DIAGNOSIS — Z9189 Other specified personal risk factors, not elsewhere classified: Secondary | ICD-10-CM

## 2023-06-22 ENCOUNTER — Ambulatory Visit: Payer: Medicaid Other | Admitting: Obstetrics and Gynecology

## 2023-06-22 ENCOUNTER — Other Ambulatory Visit: Payer: Self-pay

## 2023-06-22 ENCOUNTER — Other Ambulatory Visit (HOSPITAL_COMMUNITY)
Admission: RE | Admit: 2023-06-22 | Discharge: 2023-06-22 | Disposition: A | Discharge status: Home / Self Care | Payer: Medicaid Other | Source: Ambulatory Visit

## 2023-06-22 ENCOUNTER — Encounter: Payer: Self-pay | Admitting: Obstetrics and Gynecology

## 2023-06-22 VITALS — BP 106/68 | HR 73 | Wt 170.0 lb

## 2023-06-22 DIAGNOSIS — Z758 Other problems related to medical facilities and other health care: Secondary | ICD-10-CM | POA: Diagnosis not present

## 2023-06-22 DIAGNOSIS — Z01419 Encounter for gynecological examination (general) (routine) without abnormal findings: Secondary | ICD-10-CM

## 2023-06-22 DIAGNOSIS — Z603 Acculturation difficulty: Secondary | ICD-10-CM | POA: Diagnosis not present

## 2023-06-22 DIAGNOSIS — Z30431 Encounter for routine checking of intrauterine contraceptive device: Secondary | ICD-10-CM | POA: Insufficient documentation

## 2023-06-22 NOTE — Progress Notes (Signed)
Morgan Richardson is a 37 y.o. [phone] female here for a routine annual gynecologic exam.  Current complaints: no complaints.   Denies abnormal vaginal bleeding, discharge, pelvic pain, problems with intercourse or other gynecologic concerns.    Gynecologic History Patient's last menstrual period was 06/22/2023 (exact date). Contraception: IUD Last Pap: 2018. Results were: normal Last mammogram: NA.   Obstetric History OB History  Gravida Para Term Preterm AB Living  3 3 3     3   SAB IAB Ectopic Multiple Live Births        0 3    # Outcome Date GA Lbr Len/2nd Weight Sex Type Anes PTL Lv  3 Term 03/15/18 [redacted]w[redacted]d  6 lb 13.2 oz (3.095 kg) F CS-Vac Spinal  LIV  2 Term 05/30/13    M CS-LTranv   LIV  1 Term 11/06/07    Morgan Richardson   LIV    Past Medical History:  Diagnosis Date   Hepatitis B surface antigen positive    PONV (postoperative nausea and vomiting)     Past Surgical History:  Procedure Laterality Date   CESAREAN SECTION     CESAREAN SECTION N/A 03/15/2018   Procedure: REPEAT CESAREAN SECTION;  Surgeon: Adam Phenix, MD;  Location: [facility] BIRTHING SUITES;  Service: Obstetrics;  Laterality: N/A;    Current Outpatient Medications on File Prior to Visit  Medication Sig Dispense Refill   acetaminophen (TYLENOL) 500 MG tablet Take 1,000 mg by mouth every 6 (six) hours as needed for moderate pain or headache.      entecavir (BARACLUDE) 0.5 MG tablet Take 1 tablet (0.5 mg total) by mouth daily. 30 tablet 11   ibuprofen (ADVIL,MOTRIN) 600 MG tablet Take 1 tablet (600 mg total) by mouth every 6 (six) hours as needed. 30 tablet 0   No current facility-administered medications on file prior to visit.    Allergies  Allergen Reactions   Shrimp [Shellfish Allergy]     Social History   Socioeconomic History   Marital status: Married    Spouse name: Not on file   Number of children: Not on file   Years of education: Not on file   Highest education level: Not on file  Occupational  History   Not on file  Tobacco Use   Smoking status: Never   Smokeless tobacco: Never  Substance and Sexual Activity   Alcohol use: No   Drug use: No   Sexual activity: Yes    Birth control/protection: None, I.U.D.    Comment: declines condoms  Other Topics Concern   Not on file  Social History Narrative   Not on file   Social Determinants of Health   Financial Resource Strain: Not on file  Food Insecurity: Not on file  Transportation Needs: Not on file  Physical Activity: Not on file  Stress: Not on file  Social Connections: Not on file  Intimate Partner Violence: Not on file    Family History  Problem Relation Age of Onset   Hypertension Mother    Kidney disease Mother     The following portions of the patient's history were reviewed and updated as appropriate: allergies, current medications, past family history, past medical history, past social history, past surgical history and problem list.  Review of Systems Pertinent items are noted in HPI.   Objective:  BP 106/68   Pulse 73   Wt 170 lb (77.1 kg)   LMP 06/22/2023 (Exact Date)   BMI 33.20 kg/m  Chaperone present  CONSTITUTIONAL: Well-developed, well-nourished female in no acute distress.  HENT:  Normocephalic, atraumatic, External right and left ear normal. Oropharynx is clear and moist EYES: Conjunctivae and EOM are normal. Pupils are equal, round, and reactive to light. No scleral icterus.  NECK: Normal range of motion, supple, no masses.  Normal thyroid.  SKIN: Skin is warm and dry. No rash noted. Not diaphoretic. No erythema. No pallor. NEUROLGIC: Alert and oriented to person, place, and time. Normal reflexes, muscle tone coordination. No cranial nerve deficit noted. PSYCHIATRIC: Normal mood and affect. Normal behavior. Normal judgment and thought content. CARDIOVASCULAR: Normal heart rate noted, regular rhythm RESPIRATORY: Clear to auscultation bilaterally. Effort and breath sounds normal, no problems  with respiration noted. BREASTS: Deferred ABDOMEN: Soft, normal bowel sounds, no distention noted.  No tenderness, rebound or guarding.  PELVIC: Normal appearing external genitalia; normal appearing vaginal mucosa and cervix.  No abnormal discharge noted.  Pap smear obtained.  Normal uterine size, no other palpable masses, no uterine or adnexal tenderness. IUD string noted MUSCULOSKELETAL: Normal range of motion. No tenderness.  No cyanosis, clubbing, or edema.  2+ distal pulses.   Assessment:  Annual gynecologic examination with pap smear IUD check Plan:  Will follow up results of pap smear and manage accordingly. Video interrupter used during today's visit Routine preventative health maintenance measures emphasized. Please refer to After Visit Summary for other counseling recommendations.    [name], MD, FACOG Attending Obstetrician & Gynecologist Center for [facility], [facility]

## 2023-06-24 LAB — CYTOLOGY - PAP
Comment: NEGATIVE
Diagnosis: NEGATIVE
High risk HPV: NEGATIVE

## 2023-09-16 ENCOUNTER — Ambulatory Visit: Payer: Medicaid Other | Admitting: Internal Medicine

## 2023-09-16 ENCOUNTER — Encounter: Payer: Self-pay | Admitting: Internal Medicine

## 2023-09-16 ENCOUNTER — Other Ambulatory Visit: Payer: Self-pay

## 2023-09-16 VITALS — BP 103/70 | HR 66 | Resp 16 | Ht 60.0 in | Wt 170.2 lb

## 2023-09-16 DIAGNOSIS — Z23 Encounter for immunization: Secondary | ICD-10-CM | POA: Insufficient documentation

## 2023-09-16 DIAGNOSIS — B181 Chronic viral hepatitis B without delta-agent: Secondary | ICD-10-CM | POA: Diagnosis not present

## 2023-09-16 DIAGNOSIS — Z9189 Other specified personal risk factors, not elsewhere classified: Secondary | ICD-10-CM

## 2023-09-16 DIAGNOSIS — Z603 Acculturation difficulty: Secondary | ICD-10-CM

## 2023-09-16 MED ORDER — ENTECAVIR 0.5 MG PO TABS: 0.5000 mg | ORAL_TABLET | Freq: Every day | ORAL | 11 refills | Status: DC

## 2023-09-16 NOTE — Progress Notes (Signed)
History of Present Illness The patient, with a history of Hepatitis B, presents for a routine checkup.  She has been compliant with her medication, entecavir, and reports no issues with obtaining the medication from the pharmacy. She notes that she is almost out of medication and requests a refill.  The patient's viral load has significantly decreased from 127,000 to 11,000 since starting entecavir. She had an ultrasound in June, which showed no signs of cancer.  The patient also mentions a relative who has been diagnosed with Hepatitis B and expresses interest in making an appointment for them at the clinic.  Medications - Entecavir  Social History - Patient's relative diagnosed with hepatitis B  Physical Exam Alert, nad Pulmonary: normal respiratory effort  Results LABS Hepatitis B viral load: 11000 IU/mL  RADIOLOGY Abdominal ultrasound: No malignancy detected (03/2023)  Assessment & Plan Chronic Hepatitis B Viral load decreased from 127,000 to 11,000 on entecavir. No issues with medication access or adherence. No signs of hepatocellular carcinoma on recent ultrasound. -Continue entecavir. -Order labs today to monitor viral load. -Schedule ultrasound for continued hepatocellular carcinoma surveillance. -Refill entecavir prescription.  Family member with Hepatitis B Patient's relative also diagnosed with Hepatitis B and interested in consultation. -Encourage referral from primary care provider for consultation.   Vaccines will give influenza and CoVID vaccines today

## 2023-09-19 LAB — HEPATIC FUNCTION PANEL
AG Ratio: 1.6 (calc) (ref 1.0–2.5)
ALT: 30 U/L — ABNORMAL HIGH (ref 6–29)
AST: 20 U/L (ref 10–30)
Albumin: 4.2 g/dL (ref 3.6–5.1)
Alkaline phosphatase (APISO): 111 U/L (ref 31–125)
Bilirubin, Direct: 0.1 mg/dL (ref 0.0–0.2)
Globulin: 2.7 g/dL (ref 1.9–3.7)
Indirect Bilirubin: 0.2 mg/dL (ref 0.2–1.2)
Total Bilirubin: 0.3 mg/dL (ref 0.2–1.2)
Total Protein: 6.9 g/dL (ref 6.1–8.1)

## 2023-09-19 LAB — HEPATITIS B DNA, ULTRAQUANTITATIVE, PCR
Hepatitis B DNA: 5290 [IU]/mL — ABNORMAL HIGH
Hepatitis B virus DNA: 3.72 {Log_IU}/mL — ABNORMAL HIGH

## 2023-09-30 ENCOUNTER — Ambulatory Visit (HOSPITAL_COMMUNITY)
Admission: RE | Admit: 2023-09-30 | Discharge: 2023-09-30 | Disposition: A | Discharge status: Home / Self Care | Payer: Medicaid Other | Source: Ambulatory Visit | Attending: Internal Medicine | Admitting: Internal Medicine

## 2023-09-30 DIAGNOSIS — B181 Chronic viral hepatitis B without delta-agent: Secondary | ICD-10-CM | POA: Insufficient documentation

## 2023-09-30 DIAGNOSIS — Z9189 Other specified personal risk factors, not elsewhere classified: Secondary | ICD-10-CM | POA: Insufficient documentation

## 2023-12-30 ENCOUNTER — Other Ambulatory Visit: Payer: Self-pay

## 2023-12-30 ENCOUNTER — Ambulatory Visit: Payer: Medicaid Other | Admitting: Internal Medicine

## 2023-12-30 ENCOUNTER — Encounter: Payer: Self-pay | Admitting: Internal Medicine

## 2023-12-30 VITALS — BP 111/74 | HR 68 | Temp 97.8°F | Ht 60.0 in | Wt 171.0 lb

## 2023-12-30 DIAGNOSIS — Z603 Acculturation difficulty: Secondary | ICD-10-CM | POA: Diagnosis not present

## 2023-12-30 DIAGNOSIS — Z5181 Encounter for therapeutic drug level monitoring: Secondary | ICD-10-CM

## 2023-12-30 DIAGNOSIS — Z758 Other problems related to medical facilities and other health care: Secondary | ICD-10-CM

## 2023-12-30 DIAGNOSIS — B181 Chronic viral hepatitis B without delta-agent: Secondary | ICD-10-CM

## 2023-12-30 NOTE — Assessment & Plan Note (Signed)
Will check her hepatic function panel today

## 2023-12-30 NOTE — Assessment & Plan Note (Signed)
 He continues to do well on Entecavir and no changes indicated.  She has refills and I let her know of that.  She can return in 6 months.  Will do labs today

## 2023-12-30 NOTE — Assessment & Plan Note (Signed)
 Interpreter was present during the visit

## 2023-12-30 NOTE — Progress Notes (Signed)
   Subjective:    Patient ID: Morgan Richardson, female    DOB: [birth year], 38 y.o.   MRN: [id number]  HPI Morgan Richardson is here for follow-up of chronic active hepatitis B. She has continued on Entecavir since last year when this changed after getting Medicaid.  Taking that daily and her viral load has shown good response to treatment.  Her last hepatic function panel showed just mild inflammation.  She otherwise has no complaints today.  She is asking about refills.   Review of Systems  Constitutional:  Negative for fatigue.  Gastrointestinal:  Negative for diarrhea.  Skin:  Negative for rash.       Objective:   Physical Exam Eyes:     General: No scleral icterus. Pulmonary:     Effort: Pulmonary effort is normal.  Neurological:     Mental Status: She is alert.   SH: no alcohol        Assessment & Plan:

## 2023-12-31 ENCOUNTER — Ambulatory Visit: Payer: Medicaid Other | Admitting: Internal Medicine

## 2024-01-02 LAB — HEPATIC FUNCTION PANEL
AG Ratio: 1.6 (calc) (ref 1.0–2.5)
ALT: 31 U/L — ABNORMAL HIGH (ref 6–29)
AST: 31 U/L — ABNORMAL HIGH (ref 10–30)
Albumin: 4.2 g/dL (ref 3.6–5.1)
Alkaline phosphatase (APISO): 107 U/L (ref 31–125)
Bilirubin, Direct: 0.1 mg/dL (ref 0.0–0.2)
Globulin: 2.7 g/dL (ref 1.9–3.7)
Indirect Bilirubin: 0.3 mg/dL (ref 0.2–1.2)
Total Bilirubin: 0.4 mg/dL (ref 0.2–1.2)
Total Protein: 6.9 g/dL (ref 6.1–8.1)

## 2024-01-02 LAB — HEPATITIS B DNA, ULTRAQUANTITATIVE, PCR
Hepatitis B DNA: 4120 [IU]/mL — ABNORMAL HIGH
Hepatitis B virus DNA: 3.61 {Log_IU}/mL — ABNORMAL HIGH

## 2024-01-18 ENCOUNTER — Other Ambulatory Visit: Payer: Self-pay | Admitting: Internal Medicine

## 2024-01-19 NOTE — Telephone Encounter (Signed)
 11 refills were sent 08/2023  Sandie Ano, RN

## 2024-05-13 ENCOUNTER — Ambulatory Visit: Payer: Self-pay | Admitting: Nurse Practitioner

## 2024-05-18 ENCOUNTER — Ambulatory Visit: Payer: Self-pay | Admitting: Nurse Practitioner

## 2024-05-18 ENCOUNTER — Encounter: Payer: Self-pay | Admitting: Nurse Practitioner

## 2024-05-18 VITALS — BP 105/64 | HR 76 | Temp 98.3°F | Ht 63.0 in | Wt 167.6 lb

## 2024-05-18 DIAGNOSIS — Z0000 Encounter for general adult medical examination without abnormal findings: Secondary | ICD-10-CM

## 2024-05-18 DIAGNOSIS — R32 Unspecified urinary incontinence: Secondary | ICD-10-CM | POA: Diagnosis not present

## 2024-05-18 DIAGNOSIS — Z1329 Encounter for screening for other suspected endocrine disorder: Secondary | ICD-10-CM

## 2024-05-18 DIAGNOSIS — Z13220 Encounter for screening for lipoid disorders: Secondary | ICD-10-CM

## 2024-05-18 DIAGNOSIS — Z1231 Encounter for screening mammogram for malignant neoplasm of breast: Secondary | ICD-10-CM

## 2024-05-18 DIAGNOSIS — R600 Localized edema: Secondary | ICD-10-CM

## 2024-05-18 NOTE — Progress Notes (Signed)
 Subjective   Patient ID: Morgan Richardson, female    DOB: 1986-06-19, 38 y.o.   MRN: [id number]  Chief Complaint  Patient presents with   Establish Care    Referring provider: No ref. provider found  Morgan Richardson is a 38 y.o. female with Past Medical History: No date: Hepatitis B surface antigen positive No date: PONV (postoperative nausea and vomiting)   HPI  Patient presents today to establish care.  She would like a physical today.  Overall she is doing well other than just swelling to lower extremities after working long hours.  We did discuss low-sodium diet and wearing compression hose while working.  We also discussed elevating legs after work.  We will check labs today.  Patient is followed by infectious disease for hepatitis. Denies f/c/s, n/v/d, hemoptysis, PND, leg swelling Denies chest pain or edema      Allergies  Allergen Reactions   Shrimp [Shellfish Allergy]     Immunization History  Administered Date(s) Administered   Influenza, Seasonal, Injecte, Preservative Fre 09/16/2023   Influenza,inj,Quad PF,6+ Mos 09/29/2017, 09/13/2018, 09/30/2021, 08/29/2022   Pfizer(Comirnaty)Fall Seasonal Vaccine 12 years and older 09/16/2023   Tdap 12/22/2017   Varicella 05/31/2013    Tobacco History: Social History   Tobacco Use  Smoking Status Never  Smokeless Tobacco Never   Counseling given: Not Answered   Outpatient Encounter Medications as of 05/18/2024  Medication Sig   acetaminophen  (TYLENOL ) 500 MG tablet Take 1,000 mg by mouth every 6 (six) hours as needed for moderate pain or headache.    entecavir  (BARACLUDE ) 0.5 MG tablet Take 1 tablet (0.5 mg total) by mouth daily.   Multiple Vitamin (MULTIVITAMIN) tablet Take 1 tablet by mouth daily.   ibuprofen  (ADVIL ,MOTRIN ) 600 MG tablet Take 1 tablet (600 mg total) by mouth every 6 (six) hours as needed.   No facility-administered encounter medications on file as of 05/18/2024.    Review of Systems  Review of  Systems  Constitutional: Negative.   HENT: Negative.    Cardiovascular: Negative.   Gastrointestinal: Negative.   Allergic/Immunologic: Negative.   Neurological: Negative.   Psychiatric/Behavioral: Negative.       Objective:   BP 105/64   Pulse 76   Temp 98.3 F (36.8 C) (Oral)   Ht 5' 3 (1.6 m)   Wt 167 lb 9.6 oz (76 kg)   SpO2 99%   BMI 29.69 kg/m   Wt Readings from Last 5 Encounters:  05/18/24 167 lb 9.6 oz (76 kg)  12/30/23 171 lb (77.6 kg)  09/16/23 170 lb 3.2 oz (77.2 kg)  06/22/23 170 lb (77.1 kg)  03/03/23 167 lb (75.8 kg)     Physical Exam Vitals and nursing note reviewed.  Constitutional:      General: She is not in acute distress.    Appearance: She is well-developed.  Cardiovascular:     Rate and Rhythm: Normal rate and regular rhythm.  Pulmonary:     Effort: Pulmonary effort is normal.     Breath sounds: Normal breath sounds.  Neurological:     Mental Status: She is alert and oriented to person, place, and time.       Assessment & Plan:   Thyroid disorder screen -     TSH  Lipid screening -     Lipid panel  Routine adult health maintenance -     CBC -     Comprehensive metabolic panel with GFR  Urinary incontinence, unspecified type -  Ambulatory referral to Urogynecology  Encounter for screening mammogram for malignant neoplasm of breast -     3D Screening Mammogram, Left and Right; Future  Peripheral edema -     Brain natriuretic peptide     Return in about 1 year (around 05/18/2025) for Physical.   Bascom GORMAN Borer, NP 05/18/2024

## 2024-05-19 ENCOUNTER — Ambulatory Visit: Payer: Self-pay | Admitting: Nurse Practitioner

## 2024-05-20 LAB — COMPREHENSIVE METABOLIC PANEL WITH GFR
ALT: 31 IU/L (ref 0–32)
AST: 24 IU/L (ref 0–40)
Albumin: 4.2 g/dL (ref 3.9–4.9)
Alkaline Phosphatase: 119 IU/L (ref 44–121)
BUN/Creatinine Ratio: 16 (ref 9–23)
BUN: 11 mg/dL (ref 6–24)
Bilirubin Total: 0.4 mg/dL (ref 0.0–1.2)
CO2: 25 mmol/L (ref 20–29)
Calcium: 9.6 mg/dL (ref 8.7–10.2)
Chloride: 104 mmol/L (ref 96–106)
Creatinine, Ser: 0.68 mg/dL (ref 0.57–1.00)
Globulin, Total: 2.7 g/dL (ref 1.5–4.5)
Glucose: 108 mg/dL — ABNORMAL HIGH (ref 70–99)
Potassium: 4.5 mmol/L (ref 3.5–5.2)
Sodium: 139 mmol/L (ref 134–144)
Total Protein: 6.9 g/dL (ref 6.0–8.5)
eGFR: 113 mL/min/1.73 (ref >59)

## 2024-05-20 LAB — LIPID PANEL
Chol/HDL Ratio: 3.7 ratio (ref 0.0–4.4)
Cholesterol, Total: 204 mg/dL — ABNORMAL HIGH (ref 100–199)
HDL: 55 mg/dL (ref >39)
LDL Chol Calc (NIH): 137 mg/dL — ABNORMAL HIGH (ref 0–99)
Triglycerides: 67 mg/dL (ref 0–149)
VLDL Cholesterol Cal: 12 mg/dL (ref 5–40)

## 2024-05-20 LAB — CBC
Hematocrit: 42.8 % (ref 34.0–46.6)
Hemoglobin: 13.6 g/dL (ref 11.1–15.9)
MCH: 29.9 pg (ref 26.6–33.0)
MCHC: 31.8 g/dL (ref 31.5–35.7)
MCV: 94 fL (ref 79–97)
Platelets: 250 x10E3/uL (ref 150–450)
RBC: 4.55 x10E6/uL (ref 3.77–5.28)
RDW: 11.4 % — ABNORMAL LOW (ref 11.7–15.4)
WBC: 5.6 x10E3/uL (ref 3.4–10.8)

## 2024-05-20 LAB — BRAIN NATRIURETIC PEPTIDE: BNP: 27.6 pg/mL (ref 0.0–100.0)

## 2024-05-20 LAB — TSH: TSH: 1.93 u[IU]/mL (ref 0.450–4.500)

## 2024-05-31 ENCOUNTER — Ambulatory Visit
Admission: RE | Admit: 2024-05-31 | Discharge: 2024-05-31 | Disposition: A | Discharge status: Home / Self Care | Source: Ambulatory Visit | Attending: Nurse Practitioner

## 2024-05-31 DIAGNOSIS — Z1231 Encounter for screening mammogram for malignant neoplasm of breast: Secondary | ICD-10-CM

## 2024-06-03 ENCOUNTER — Other Ambulatory Visit: Payer: Self-pay | Admitting: Nurse Practitioner

## 2024-06-03 DIAGNOSIS — R928 Other abnormal and inconclusive findings on diagnostic imaging of breast: Secondary | ICD-10-CM

## 2024-06-15 ENCOUNTER — Ambulatory Visit

## 2024-06-15 ENCOUNTER — Ambulatory Visit
Admission: RE | Admit: 2024-06-15 | Discharge: 2024-06-15 | Disposition: A | Discharge status: Home / Self Care | Source: Ambulatory Visit | Attending: Nurse Practitioner

## 2024-06-15 DIAGNOSIS — R928 Other abnormal and inconclusive findings on diagnostic imaging of breast: Secondary | ICD-10-CM | POA: Diagnosis not present

## 2024-06-28 ENCOUNTER — Ambulatory Visit: Admitting: Internal Medicine

## 2024-06-29 ENCOUNTER — Ambulatory Visit: Admitting: Internal Medicine

## 2024-07-18 ENCOUNTER — Ambulatory Visit: Admitting: Internal Medicine

## 2024-07-27 ENCOUNTER — Encounter: Payer: Self-pay | Admitting: Internal Medicine

## 2024-07-27 ENCOUNTER — Ambulatory Visit: Admitting: Internal Medicine

## 2024-07-27 ENCOUNTER — Other Ambulatory Visit: Payer: Self-pay

## 2024-07-27 VITALS — BP 110/76 | HR 69 | Temp 98.9°F | Resp 16

## 2024-07-27 DIAGNOSIS — B181 Chronic viral hepatitis B without delta-agent: Secondary | ICD-10-CM

## 2024-07-27 MED ORDER — ENTECAVIR 0.5 MG PO TABS: 0.5000 mg | ORAL_TABLET | Freq: Every day | ORAL | 11 refills | Status: AC

## 2024-07-27 NOTE — Progress Notes (Signed)
 Patient: Morgan Richardson  DOB: 1986-03-17 MRN: [id number] PCP: Patient, No Pcp Per    Chief Complaint  Patient presents with   Follow-up    Hep B      Patient Active Problem List   Diagnosis Date Noted   Need for prophylactic vaccination and inoculation against influenza 09/16/2023   Visit for routine gyn exam 06/22/2023   IUD check up 06/22/2023   At risk for cancer 03/14/2020   Medication monitoring encounter 03/15/2019   Liver fibrosis 06/22/2018   Chronic viral hepatitis B without delta-agent (HCC) 03/03/2018   Language barrier affecting health care 09/29/2017     Subjective:  Morgan Richardson is a 38 y.o. F with PMHx as below presents for management of  chronic active HBV on entecavir . No missed doses since LV. No new complaints.  Please see HPI form 12/30/23 for more details She has continued on Entecavir  since last year when this changed after getting Medicaid. Taking that daily and her viral load has shown good response to treatment. Her last hepatic function panel showed just mild inflammation. She otherwise has no complaints today. She is asking about refills.  Review of Systems  All other systems reviewed and are negative.   Past Medical History:  Diagnosis Date   Hepatitis B surface antigen positive    PONV (postoperative nausea and vomiting)     Outpatient Medications Prior to Visit  Medication Sig Dispense Refill   acetaminophen  (TYLENOL ) 500 MG tablet Take 1,000 mg by mouth every 6 (six) hours as needed for moderate pain or headache.      entecavir  (BARACLUDE ) 0.5 MG tablet Take 1 tablet (0.5 mg total) by mouth daily. 30 tablet 11   ibuprofen  (ADVIL ,MOTRIN ) 600 MG tablet Take 1 tablet (600 mg total) by mouth every 6 (six) hours as needed. 30 tablet 0   Multiple Vitamin (MULTIVITAMIN) tablet Take 1 tablet by mouth daily.     No facility-administered medications prior to visit.     Allergies  Allergen Reactions   Shrimp [Shellfish Allergy]     Social  History   Tobacco Use   Smoking status: Never   Smokeless tobacco: Never  Substance Use Topics   Alcohol use: No   Drug use: No    Family History  Problem Relation Age of Onset   Hypertension Mother    Kidney disease Mother    Ovarian cancer Paternal Aunt 46 - 89   Breast cancer Neg Hx    BRCA 1/2 Neg Hx     Objective:   Vitals:   07/27/24 1556  BP: 110/76  Pulse: 69  Resp: 16  Temp: 98.9 F (37.2 C)  TempSrc: Oral  SpO2: 100%   There is no height or weight on file to calculate BMI.  Physical Exam Constitutional:      Appearance: Normal appearance.  HENT:     Head: Normocephalic and atraumatic.     Right Ear: Tympanic membrane normal.     Left Ear: Tympanic membrane normal.     Nose: Nose normal.     Mouth/Throat:     Mouth: Mucous membranes are moist.  Eyes:     Extraocular Movements: Extraocular movements intact.     Conjunctiva/sclera: Conjunctivae normal.     Pupils: Pupils are equal, round, and reactive to light.  Cardiovascular:     Rate and Rhythm: Normal rate and regular rhythm.     Heart sounds: No murmur heard.    No friction rub.  No gallop.  Pulmonary:     Effort: Pulmonary effort is normal.     Breath sounds: Normal breath sounds.  Abdominal:     General: Abdomen is flat.     Palpations: Abdomen is soft.  Musculoskeletal:        General: Normal range of motion.  Skin:    General: Skin is warm and dry.  Neurological:     General: No focal deficit present.     Mental Status: She is alert and oriented to person, place, and time.  Psychiatric:        Mood and Affect: Mood normal.     Lab Results: Lab Results  Component Value Date   WBC 5.6 05/18/2024   HGB 13.6 05/18/2024   HCT 42.8 05/18/2024   MCV 94 05/18/2024   PLT 250 05/18/2024    Lab Results  Component Value Date   CREATININE 0.68 05/18/2024   BUN 11 05/18/2024   NA 139 05/18/2024   K 4.5 05/18/2024   CL 104 05/18/2024   CO2 25 05/18/2024    Lab Results  Component  Value Date   ALT 31 05/18/2024   AST 24 05/18/2024   ALKPHOS 119 05/18/2024   BILITOT 0.4 05/18/2024     Assessment & Plan:  #Chronic HBV -reviewed DNA on 12/30/23 4k, ALT30. 09/30/24 US  showed increased hepatic echotexture, Plan -CBC and CMP, HBV DNA -no missed doses -Continue entecavir . US  ordered -F/u 6 months Loney Stank, MD [facility]   07/27/24  3:59 PM I have personally spent 41 minutes involved in face-to-face and non-face-to-face activities for this patient on the day of the visit. Professional time spent includes the following activities: Preparing to see the patient (review of tests), Obtaining and/or reviewing separately obtained history (admission/discharge record), Performing a medically appropriate examination and/or evaluation , Ordering medications/tests/procedures, referring and communicating with other health care professionals, Documenting clinical information in the EMR, Independently interpreting results (not separately reported), Communicating results to the patient/family/caregiver, Counseling and educating the patient/family/caregiver and Care coordination (not separately reported).

## 2024-07-30 LAB — CBC WITH DIFFERENTIAL/PLATELET
Absolute Lymphocytes: 1439 {cells}/uL (ref 850–3900)
Absolute Monocytes: 291 {cells}/uL (ref 200–950)
Basophils Absolute: 22 {cells}/uL (ref 0–200)
Basophils Relative: 0.4 %
Eosinophils Absolute: 73 {cells}/uL (ref 15–500)
Eosinophils Relative: 1.3 %
HCT: 41.9 % (ref 35.0–45.0)
Hemoglobin: 13.9 g/dL (ref 11.7–15.5)
MCH: 30.7 pg (ref 27.0–33.0)
MCHC: 33.2 g/dL (ref 32.0–36.0)
MCV: 92.5 fL (ref 80.0–100.0)
MPV: 11.6 fL (ref 7.5–12.5)
Monocytes Relative: 5.2 %
Neutro Abs: 3774 {cells}/uL (ref 1500–7800)
Neutrophils Relative %: 67.4 %
Platelets: 239 Thousand/uL (ref 140–400)
RBC: 4.53 Million/uL (ref 3.80–5.10)
RDW: 11.3 % (ref 11.0–15.0)
Total Lymphocyte: 25.7 %
WBC: 5.6 Thousand/uL (ref 3.8–10.8)

## 2024-07-30 LAB — COMPLETE METABOLIC PANEL WITHOUT GFR
AG Ratio: 1.6 (calc) (ref 1.0–2.5)
ALT: 34 U/L — ABNORMAL HIGH (ref 6–29)
AST: 21 U/L (ref 10–30)
Albumin: 4.3 g/dL (ref 3.6–5.1)
Alkaline phosphatase (APISO): 109 U/L (ref 31–125)
BUN: 14 mg/dL (ref 7–25)
CO2: 33 mmol/L — ABNORMAL HIGH (ref 20–32)
Calcium: 9.8 mg/dL (ref 8.6–10.2)
Chloride: 102 mmol/L (ref 98–110)
Creat: 0.6 mg/dL (ref 0.50–0.99)
Globulin: 2.7 g/dL (ref 1.9–3.7)
Glucose, Bld: 156 mg/dL — ABNORMAL HIGH (ref 65–99)
Potassium: 4.4 mmol/L (ref 3.5–5.3)
Sodium: 139 mmol/L (ref 135–146)
Total Bilirubin: 0.5 mg/dL (ref 0.2–1.2)
Total Protein: 7 g/dL (ref 6.1–8.1)

## 2024-07-30 LAB — HEPATITIS B DNA, ULTRAQUANTITATIVE, PCR
Hepatitis B DNA: 4760 [IU]/mL — ABNORMAL HIGH
Hepatitis B virus DNA: 3.68 {Log_IU}/mL — ABNORMAL HIGH

## 2024-07-30 LAB — PROTIME-INR
INR: 1
Prothrombin Time: 10.2 s (ref 9.0–11.5)

## 2024-08-24 ENCOUNTER — Ambulatory Visit (HOSPITAL_COMMUNITY)
Admission: RE | Admit: 2024-08-24 | Discharge: 2024-08-24 | Disposition: A | Discharge status: Home / Self Care | Source: Ambulatory Visit | Attending: Internal Medicine | Admitting: Internal Medicine

## 2024-08-24 DIAGNOSIS — B181 Chronic viral hepatitis B without delta-agent: Secondary | ICD-10-CM | POA: Diagnosis not present

## 2024-08-30 ENCOUNTER — Encounter: Payer: Self-pay | Admitting: Obstetrics and Gynecology

## 2024-09-07 ENCOUNTER — Ambulatory Visit: Admitting: Obstetrics and Gynecology

## 2024-09-09 ENCOUNTER — Ambulatory Visit: Admitting: Obstetrics and Gynecology

## 2024-11-18 ENCOUNTER — Ambulatory Visit: Payer: Self-pay | Admitting: Nurse Practitioner

## 2024-11-18 ENCOUNTER — Encounter: Payer: Self-pay | Admitting: Nurse Practitioner

## 2024-11-18 VITALS — BP 119/80 | HR 94 | Temp 98.2°F | Wt 166.0 lb

## 2024-11-18 DIAGNOSIS — R519 Headache, unspecified: Secondary | ICD-10-CM | POA: Diagnosis not present

## 2024-11-18 DIAGNOSIS — R7301 Impaired fasting glucose: Secondary | ICD-10-CM

## 2024-11-18 DIAGNOSIS — G8929 Other chronic pain: Secondary | ICD-10-CM

## 2024-11-18 LAB — POCT GLYCOSYLATED HEMOGLOBIN (HGB A1C): Hemoglobin A1C: 5.2 % (ref 4.0–5.6)

## 2024-11-18 MED ORDER — MAGNESIUM 100 MG PO TABS: 1.0000 | ORAL_TABLET | Freq: Every day | ORAL | 2 refills | Status: AC

## 2024-11-18 MED ORDER — IBUPROFEN 600 MG PO TABS: 600.0000 mg | ORAL_TABLET | Freq: Four times a day (QID) | ORAL | 0 refills | Status: AC | PRN

## 2024-11-18 NOTE — Progress Notes (Signed)
 "  Subjective   Patient ID: Morgan Richardson, female    DOB: 1985-11-29, 39 y.o.   MRN: [id number]  Chief Complaint  Patient presents with   Follow-up    6 month. Hasn't done the scan yet for urinary problem. Before period get severe body chills and HA's x 1 year.    Referring provider: No ref. provider found  Morgan Richardson is a 39 y.o. female with Past Medical History: No date: Hepatitis B surface antigen positive No date: PONV (postoperative nausea and vomiting)   HPI  Patient presents today for follow-up visit.  Overall she has been doing well.  She does complain of chills and headaches around the time of her menstrual cycle for the past year.  We will order magnesium supplement and ibuprofen  for this.  At last check patient's blood sugar was elevated.  We will check A1c today.  Still need to follow-up with urology for bladder scan. Denies f/c/s, n/v/d, hemoptysis, PND, leg swelling Denies chest pain or edema   A1C: 5.2  Allergies[1]  Immunization History  Administered Date(s) Administered   Influenza, Seasonal, Injecte, Preservative Fre 09/16/2023   Influenza,inj,Quad PF,6+ Mos 09/29/2017, 09/13/2018, 09/30/2021, 08/29/2022   Pfizer(Comirnaty)Fall Seasonal Vaccine 12 years and older 09/16/2023   Tdap 12/22/2017   Varicella 05/31/2013    Tobacco History: Tobacco Use History[2] Counseling given: Not Answered   Outpatient Encounter Medications as of 11/18/2024  Medication Sig   entecavir  (BARACLUDE ) 0.5 MG tablet Take 1 tablet (0.5 mg total) by mouth daily.   Magnesium 100 MG TABS Take 1 tablet (100 mg total) by mouth daily.   Multiple Vitamin (MULTIVITAMIN) tablet Take 1 tablet by mouth daily.   acetaminophen  (TYLENOL ) 500 MG tablet Take 1,000 mg by mouth every 6 (six) hours as needed for moderate pain or headache.  (Patient not taking: Reported on 11/18/2024)   ibuprofen  (ADVIL ) 600 MG tablet Take 1 tablet (600 mg total) by mouth every 6 (six) hours as needed.    [DISCONTINUED] ibuprofen  (ADVIL ,MOTRIN ) 600 MG tablet Take 1 tablet (600 mg total) by mouth every 6 (six) hours as needed. (Patient not taking: Reported on 11/18/2024)   No facility-administered encounter medications on file as of 11/18/2024.    Review of Systems  Review of Systems  Constitutional: Negative.   HENT: Negative.    Cardiovascular: Negative.   Gastrointestinal: Negative.   Allergic/Immunologic: Negative.   Neurological: Negative.   Psychiatric/Behavioral: Negative.       Objective:   BP 119/80   Pulse 94   Temp 98.2 F (36.8 C) (Temporal)   Wt 166 lb (75.3 kg)   SpO2 98%   BMI 29.41 kg/m   Wt Readings from Last 5 Encounters:  11/18/24 166 lb (75.3 kg)  05/18/24 167 lb 9.6 oz (76 kg)  12/30/23 171 lb (77.6 kg)  09/16/23 170 lb 3.2 oz (77.2 kg)  06/22/23 170 lb (77.1 kg)     Physical Exam Vitals and nursing note reviewed.  Constitutional:      General: She is not in acute distress.    Appearance: She is well-developed.  Cardiovascular:     Rate and Rhythm: Normal rate and regular rhythm.  Pulmonary:     Effort: Pulmonary effort is normal.     Breath sounds: Normal breath sounds.  Neurological:     Mental Status: She is alert and oriented to person, place, and time.       Assessment & Plan:   Chronic nonintractable headache, unspecified headache type -  Ibuprofen ; Take 1 tablet (600 mg total) by mouth every 6 (six) hours as needed.  Dispense: 30 tablet; Refill: 0 -     Magnesium; Take 1 tablet (100 mg total) by mouth daily.  Dispense: 90 tablet; Refill: 2     Return in about 6 months (around 05/18/2025) for Physical.    Bascom GORMAN Borer, NP 11/18/2024     [1]  Allergies Allergen Reactions   Shrimp [Shellfish Allergy]   [2]  Social History Tobacco Use  Smoking Status Never  Smokeless Tobacco Never   "

## 2025-01-23 ENCOUNTER — Ambulatory Visit: Admitting: Internal Medicine

## 2025-05-22 ENCOUNTER — Encounter: Payer: Self-pay | Admitting: Nurse Practitioner
